# Patient Record
Sex: Female | Born: 1950
Health system: Southern US, Community
[De-identification: ages and names within clinical notes are randomized; demographics above are authoritative.]

## PROBLEM LIST (undated history)

## (undated) DIAGNOSIS — I10 Essential (primary) hypertension: Secondary | ICD-10-CM

## (undated) DIAGNOSIS — E785 Hyperlipidemia, unspecified: Secondary | ICD-10-CM

## (undated) DIAGNOSIS — I2510 Atherosclerotic heart disease of native coronary artery without angina pectoris: Secondary | ICD-10-CM

## (undated) DIAGNOSIS — G4733 Obstructive sleep apnea (adult) (pediatric): Secondary | ICD-10-CM

## (undated) DIAGNOSIS — E669 Obesity, unspecified: Secondary | ICD-10-CM

## (undated) DIAGNOSIS — M329 Systemic lupus erythematosus, unspecified: Secondary | ICD-10-CM

## (undated) DIAGNOSIS — I4510 Unspecified right bundle-branch block: Secondary | ICD-10-CM

## (undated) DIAGNOSIS — G4730 Sleep apnea, unspecified: Secondary | ICD-10-CM

## (undated) HISTORY — DX: Sleep apnea, unspecified: G47.30

## (undated) HISTORY — DX: Systemic lupus erythematosus, unspecified: M32.9

## (undated) HISTORY — DX: Essential (primary) hypertension: I10

## (undated) HISTORY — DX: Obesity, unspecified: E66.9

## (undated) HISTORY — PX: CORONARY ANGIOPLASTY: SHX604

## (undated) HISTORY — DX: Atherosclerotic heart disease of native coronary artery without angina pectoris: I25.10

## (undated) HISTORY — DX: Hyperlipidemia, unspecified: E78.5

## (undated) HISTORY — DX: Unspecified right bundle-branch block: I45.10

## (undated) HISTORY — DX: Obstructive sleep apnea (adult) (pediatric): G47.33

---

## 1998-11-07 ENCOUNTER — Other Ambulatory Visit: Admission: RE | Admit: 1998-11-07 | Discharge: 1998-11-07 | Payer: Self-pay | Admitting: Internal Medicine

## 2000-07-24 ENCOUNTER — Encounter: Payer: Self-pay | Admitting: Emergency Medicine

## 2000-07-24 ENCOUNTER — Inpatient Hospital Stay (HOSPITAL_COMMUNITY): Admission: EM | Admit: 2000-07-24 | Discharge: 2000-07-27 | Payer: Self-pay | Admitting: Emergency Medicine

## 2000-08-29 ENCOUNTER — Other Ambulatory Visit: Admission: RE | Admit: 2000-08-29 | Discharge: 2000-08-29 | Payer: Self-pay | Admitting: *Deleted

## 2001-01-01 ENCOUNTER — Encounter (INDEPENDENT_AMBULATORY_CARE_PROVIDER_SITE_OTHER): Payer: Self-pay

## 2001-01-01 ENCOUNTER — Observation Stay (HOSPITAL_COMMUNITY): Admission: RE | Admit: 2001-01-01 | Discharge: 2001-01-02 | Payer: Self-pay | Admitting: Obstetrics and Gynecology

## 2001-03-06 ENCOUNTER — Ambulatory Visit (HOSPITAL_COMMUNITY): Admission: RE | Admit: 2001-03-06 | Discharge: 2001-03-06 | Payer: Self-pay | Admitting: Obstetrics and Gynecology

## 2001-03-06 ENCOUNTER — Encounter: Payer: Self-pay | Admitting: Obstetrics and Gynecology

## 2001-09-18 ENCOUNTER — Other Ambulatory Visit: Admission: RE | Admit: 2001-09-18 | Discharge: 2001-09-18 | Payer: Self-pay | Admitting: Obstetrics and Gynecology

## 2003-01-06 ENCOUNTER — Inpatient Hospital Stay (HOSPITAL_COMMUNITY): Admission: EM | Admit: 2003-01-06 | Discharge: 2003-01-08 | Payer: Self-pay | Admitting: Emergency Medicine

## 2003-01-07 ENCOUNTER — Encounter (INDEPENDENT_AMBULATORY_CARE_PROVIDER_SITE_OTHER): Payer: Self-pay | Admitting: Cardiology

## 2003-01-08 ENCOUNTER — Encounter: Payer: Self-pay | Admitting: *Deleted

## 2003-02-22 ENCOUNTER — Encounter: Payer: Self-pay | Admitting: Internal Medicine

## 2003-02-22 ENCOUNTER — Encounter: Admission: RE | Admit: 2003-02-22 | Discharge: 2003-02-22 | Payer: Self-pay | Admitting: Internal Medicine

## 2003-07-06 ENCOUNTER — Ambulatory Visit (HOSPITAL_COMMUNITY): Admission: RE | Admit: 2003-07-06 | Discharge: 2003-07-06 | Payer: Self-pay | Admitting: Family Medicine

## 2003-07-06 ENCOUNTER — Encounter: Payer: Self-pay | Admitting: Family Medicine

## 2003-08-03 ENCOUNTER — Encounter: Payer: Self-pay | Admitting: Family Medicine

## 2003-08-03 ENCOUNTER — Ambulatory Visit (HOSPITAL_COMMUNITY): Admission: RE | Admit: 2003-08-03 | Discharge: 2003-08-03 | Payer: Self-pay | Admitting: Family Medicine

## 2003-09-03 ENCOUNTER — Ambulatory Visit (HOSPITAL_COMMUNITY): Admission: RE | Admit: 2003-09-03 | Discharge: 2003-09-03 | Payer: Self-pay | Admitting: Family Medicine

## 2003-09-09 ENCOUNTER — Ambulatory Visit (HOSPITAL_COMMUNITY): Admission: RE | Admit: 2003-09-09 | Discharge: 2003-09-09 | Payer: Self-pay | Admitting: Family Medicine

## 2004-03-08 ENCOUNTER — Ambulatory Visit (HOSPITAL_COMMUNITY): Admission: RE | Admit: 2004-03-08 | Discharge: 2004-03-08 | Payer: Self-pay | Admitting: Pulmonary Disease

## 2004-03-10 ENCOUNTER — Ambulatory Visit (HOSPITAL_COMMUNITY): Admission: RE | Admit: 2004-03-10 | Discharge: 2004-03-10 | Payer: Self-pay | Admitting: Family Medicine

## 2004-03-24 ENCOUNTER — Inpatient Hospital Stay (HOSPITAL_COMMUNITY): Admission: RE | Admit: 2004-03-24 | Discharge: 2004-03-30 | Payer: Self-pay | Admitting: Thoracic Surgery

## 2004-03-24 ENCOUNTER — Encounter (INDEPENDENT_AMBULATORY_CARE_PROVIDER_SITE_OTHER): Payer: Self-pay | Admitting: Specialist

## 2004-04-05 ENCOUNTER — Encounter: Admission: RE | Admit: 2004-04-05 | Discharge: 2004-04-05 | Payer: Self-pay | Admitting: Thoracic Surgery

## 2004-05-09 ENCOUNTER — Encounter: Admission: RE | Admit: 2004-05-09 | Discharge: 2004-05-09 | Payer: Self-pay | Admitting: Thoracic Surgery

## 2004-07-11 ENCOUNTER — Encounter: Admission: RE | Admit: 2004-07-11 | Discharge: 2004-07-11 | Payer: Self-pay | Admitting: Thoracic Surgery

## 2004-09-20 ENCOUNTER — Encounter: Admission: RE | Admit: 2004-09-20 | Discharge: 2004-09-20 | Payer: Self-pay

## 2004-10-10 ENCOUNTER — Encounter: Admission: RE | Admit: 2004-10-10 | Discharge: 2004-10-10 | Payer: Self-pay | Admitting: Thoracic Surgery

## 2007-09-01 ENCOUNTER — Inpatient Hospital Stay (HOSPITAL_COMMUNITY): Admission: EM | Admit: 2007-09-01 | Discharge: 2007-09-10 | Payer: Self-pay | Admitting: Emergency Medicine

## 2007-09-01 ENCOUNTER — Ambulatory Visit: Payer: Self-pay | Admitting: Surgery

## 2007-09-02 HISTORY — PX: CORONARY ARTERY BYPASS GRAFT: SHX141

## 2007-09-02 HISTORY — PX: CARDIAC CATHETERIZATION: SHX172

## 2007-09-30 ENCOUNTER — Ambulatory Visit: Payer: Self-pay | Admitting: Surgery

## 2007-09-30 ENCOUNTER — Encounter: Admission: RE | Admit: 2007-09-30 | Discharge: 2007-09-30 | Payer: Self-pay | Admitting: Surgery

## 2008-01-09 ENCOUNTER — Ambulatory Visit: Payer: Self-pay | Admitting: Surgery

## 2009-05-05 ENCOUNTER — Other Ambulatory Visit: Admission: RE | Admit: 2009-05-05 | Discharge: 2009-05-05 | Payer: Self-pay | Admitting: Family Medicine

## 2009-05-16 ENCOUNTER — Ambulatory Visit (HOSPITAL_COMMUNITY): Admission: RE | Admit: 2009-05-16 | Discharge: 2009-05-16 | Payer: Self-pay | Admitting: Family Medicine

## 2010-06-27 ENCOUNTER — Ambulatory Visit (HOSPITAL_COMMUNITY): Admission: RE | Admit: 2010-06-27 | Discharge: 2010-06-27 | Payer: Self-pay | Admitting: Family Medicine

## 2010-06-28 ENCOUNTER — Ambulatory Visit: Payer: Self-pay | Admitting: Oncology

## 2010-07-24 LAB — CBC WITH DIFFERENTIAL/PLATELET
BASO%: 0.2 % (ref 0.0–2.0)
Basophils Absolute: 0 10*3/uL (ref 0.0–0.1)
EOS%: 1.1 % (ref 0.0–7.0)
Eosinophils Absolute: 0.1 10*3/uL (ref 0.0–0.5)
HCT: 42.7 % (ref 34.8–46.6)
HGB: 13.8 g/dL (ref 11.6–15.9)
LYMPH%: 48.1 % (ref 14.0–49.7)
MCH: 29.6 pg (ref 25.1–34.0)
MCHC: 32.3 g/dL (ref 31.5–36.0)
MCV: 91.4 fL (ref 79.5–101.0)
MONO#: 0.8 10*3/uL (ref 0.1–0.9)
MONO%: 15.8 % — ABNORMAL HIGH (ref 0.0–14.0)
NEUT#: 1.8 10*3/uL (ref 1.5–6.5)
NEUT%: 34.8 % — ABNORMAL LOW (ref 38.4–76.8)
Platelets: 173 10*3/uL (ref 145–400)
RBC: 4.67 10*6/uL (ref 3.70–5.45)
RDW: 14.9 % — ABNORMAL HIGH (ref 11.2–14.5)
WBC: 5.3 10*3/uL (ref 3.9–10.3)
lymph#: 2.6 10*3/uL (ref 0.9–3.3)
nRBC: 0 % (ref 0–0)

## 2010-07-24 LAB — COMPREHENSIVE METABOLIC PANEL
ALT: 20 U/L (ref 0–35)
AST: 33 U/L (ref 0–37)
Albumin: 3.8 g/dL (ref 3.5–5.2)
Alkaline Phosphatase: 62 U/L (ref 39–117)
BUN: 9 mg/dL (ref 6–23)
CO2: 29 mEq/L (ref 19–32)
Calcium: 9.1 mg/dL (ref 8.4–10.5)
Chloride: 105 mEq/L (ref 96–112)
Creatinine, Ser: 0.84 mg/dL (ref 0.40–1.20)
Glucose, Bld: 86 mg/dL (ref 70–99)
Potassium: 3.9 mEq/L (ref 3.5–5.3)
Sodium: 139 mEq/L (ref 135–145)
Total Bilirubin: 0.9 mg/dL (ref 0.3–1.2)
Total Protein: 8.4 g/dL — ABNORMAL HIGH (ref 6.0–8.3)

## 2010-07-24 LAB — LACTATE DEHYDROGENASE: LDH: 207 U/L (ref 94–250)

## 2010-07-31 LAB — SEDIMENTATION RATE: Sed Rate: 22 mm/hr (ref 0–22)

## 2010-07-31 LAB — HYPERCOAGULABLE PANEL, COMPREHENSIVE
AntiThromb III Func: 109 % (ref 76–126)
Anticardiolipin IgA: 6 APL U/mL (ref <22)
Anticardiolipin IgG: 5 GPL U/mL (ref <23)
Anticardiolipin IgM: 2 MPL U/mL (ref <11)
Beta-2 Glyco I IgG: 3 G Units (ref <20)
Beta-2-Glycoprotein I IgA: 5 A Units (ref <20)
Beta-2-Glycoprotein I IgM: 3 M Units (ref <20)
DRVVT 1:1 Mix: 41.6 secs (ref 36.2–44.3)
DRVVT: 48.8 secs — ABNORMAL HIGH (ref 36.2–44.3)
Homocysteine: 9.7 umol/L (ref 4.0–15.4)
Lupus Anticoagulant: NOT DETECTED
PTT Lupus Anticoagulant: 40 secs (ref 30.0–45.6)
Protein C Activity: 15 % — ABNORMAL LOW (ref 75–133)
Protein C, Total: 52 % — ABNORMAL LOW (ref 70–140)
Protein S Activity: <15 % (ref 69–129)
Protein S Ag, Total: 60 % — ABNORMAL LOW (ref 70–140)

## 2010-07-31 LAB — ANTI-NUCLEAR AB-TITER (ANA TITER): ANA Titer 1: 1:640 {titer} — ABNORMAL HIGH

## 2010-07-31 LAB — C-REACTIVE PROTEIN: CRP: 0.5 mg/dL (ref <0.6)

## 2010-07-31 LAB — ANA: Anti Nuclear Antibody(ANA): POSITIVE — AB

## 2010-07-31 LAB — D-DIMER, QUANTITATIVE (NOT AT ARMC): D-Dimer, Quant: 0.22 ug/mL-FEU (ref 0.00–0.48)

## 2010-08-16 ENCOUNTER — Ambulatory Visit: Payer: Self-pay | Admitting: Oncology

## 2010-08-22 ENCOUNTER — Ambulatory Visit: Payer: Self-pay | Admitting: Vascular Surgery

## 2010-08-22 ENCOUNTER — Encounter (HOSPITAL_COMMUNITY): Payer: Self-pay | Admitting: Oncology

## 2010-08-22 ENCOUNTER — Ambulatory Visit: Admission: RE | Admit: 2010-08-22 | Discharge: 2010-08-22 | Payer: Self-pay | Admitting: Oncology

## 2010-10-17 ENCOUNTER — Ambulatory Visit: Payer: Self-pay | Admitting: Oncology

## 2010-10-19 LAB — CBC WITH DIFFERENTIAL/PLATELET
BASO%: 0 % (ref 0.0–2.0)
Basophils Absolute: 0 10*3/uL (ref 0.0–0.1)
EOS%: 1.1 % (ref 0.0–7.0)
Eosinophils Absolute: 0.1 10*3/uL (ref 0.0–0.5)
HCT: 43.9 % (ref 34.8–46.6)
HGB: 14.7 g/dL (ref 11.6–15.9)
LYMPH%: 41.2 % (ref 14.0–49.7)
MCH: 29.8 pg (ref 25.1–34.0)
MCHC: 33.5 g/dL (ref 31.5–36.0)
MCV: 89 fL (ref 79.5–101.0)
MONO#: 0.5 10*3/uL (ref 0.1–0.9)
MONO%: 10 % (ref 0.0–14.0)
NEUT#: 2.3 10*3/uL (ref 1.5–6.5)
NEUT%: 47.7 % (ref 38.4–76.8)
Platelets: 193 10*3/uL (ref 145–400)
RBC: 4.93 10*6/uL (ref 3.70–5.45)
RDW: 13.2 % (ref 11.2–14.5)
WBC: 4.7 10*3/uL (ref 3.9–10.3)
lymph#: 1.9 10*3/uL (ref 0.9–3.3)
nRBC: 0 % (ref 0–0)

## 2010-10-24 LAB — COMPREHENSIVE METABOLIC PANEL
ALT: 20 U/L (ref 0–35)
AST: 34 U/L (ref 0–37)
Albumin: 4.1 g/dL (ref 3.5–5.2)
Alkaline Phosphatase: 71 U/L (ref 39–117)
BUN: 14 mg/dL (ref 6–23)
CO2: 25 mEq/L (ref 19–32)
Calcium: 9.4 mg/dL (ref 8.4–10.5)
Chloride: 102 mEq/L (ref 96–112)
Creatinine, Ser: 0.87 mg/dL (ref 0.40–1.20)
Glucose, Bld: 112 mg/dL — ABNORMAL HIGH (ref 70–99)
Potassium: 4 mEq/L (ref 3.5–5.3)
Sodium: 136 mEq/L (ref 135–145)
Total Bilirubin: 0.3 mg/dL (ref 0.3–1.2)
Total Protein: 7.6 g/dL (ref 6.0–8.3)

## 2010-10-24 LAB — PROTEIN C ACTIVITY: Protein C Activity: 140 % — ABNORMAL HIGH (ref 75–133)

## 2010-10-24 LAB — D-DIMER, QUANTITATIVE (NOT AT ARMC): D-Dimer, Quant: 0.38 ug/mL-FEU (ref 0.00–0.48)

## 2010-10-24 LAB — PROTEIN C, TOTAL: Protein C, Total: 124 % (ref 70–140)

## 2010-10-24 LAB — LACTATE DEHYDROGENASE: LDH: 201 U/L (ref 94–250)

## 2010-10-24 LAB — PROTEIN S ACTIVITY: Protein S Activity: 46 % — ABNORMAL LOW (ref 69–129)

## 2010-10-24 LAB — PROTEIN S, TOTAL: Protein S Ag, Total: 99 % (ref 70–140)

## 2010-11-16 ENCOUNTER — Ambulatory Visit: Payer: Self-pay | Admitting: Oncology

## 2010-11-22 LAB — PROTEIN S, TOTAL: Protein S Ag, Total: 98 % (ref 70–140)

## 2010-11-22 LAB — PROTEIN S, ANTIGEN, FREE: Protein S Ag, Free: 60 % normal (ref 50–147)

## 2010-11-22 LAB — PROTEIN S ACTIVITY: Protein S Activity: 43 % — ABNORMAL LOW (ref 69–129)

## 2011-02-20 ENCOUNTER — Other Ambulatory Visit (HOSPITAL_COMMUNITY): Payer: Self-pay | Admitting: Oncology

## 2011-02-20 ENCOUNTER — Encounter (HOSPITAL_BASED_OUTPATIENT_CLINIC_OR_DEPARTMENT_OTHER): Payer: Medicare Other | Admitting: Oncology

## 2011-02-20 DIAGNOSIS — M329 Systemic lupus erythematosus, unspecified: Secondary | ICD-10-CM

## 2011-02-20 DIAGNOSIS — Z7901 Long term (current) use of anticoagulants: Secondary | ICD-10-CM

## 2011-02-20 DIAGNOSIS — Z86718 Personal history of other venous thrombosis and embolism: Secondary | ICD-10-CM

## 2011-02-20 LAB — CBC WITH DIFFERENTIAL/PLATELET
BASO%: 0.2 % (ref 0.0–2.0)
Basophils Absolute: 0 10*3/uL (ref 0.0–0.1)
EOS%: 1.4 % (ref 0.0–7.0)
Eosinophils Absolute: 0.1 10*3/uL (ref 0.0–0.5)
HCT: 45.3 % (ref 34.8–46.6)
HGB: 14.8 g/dL (ref 11.6–15.9)
LYMPH%: 37.9 % (ref 14.0–49.7)
MCH: 29.2 pg (ref 25.1–34.0)
MCHC: 32.7 g/dL (ref 31.5–36.0)
MCV: 89.3 fL (ref 79.5–101.0)
MONO#: 0.4 10*3/uL (ref 0.1–0.9)
MONO%: 8.3 % (ref 0.0–14.0)
NEUT#: 2.2 10*3/uL (ref 1.5–6.5)
NEUT%: 52.2 % (ref 38.4–76.8)
Platelets: 178 10*3/uL (ref 145–400)
RBC: 5.07 10*6/uL (ref 3.70–5.45)
RDW: 13.9 % (ref 11.2–14.5)
WBC: 4.2 10*3/uL (ref 3.9–10.3)
lymph#: 1.6 10*3/uL (ref 0.9–3.3)
nRBC: 0 % (ref 0–0)

## 2011-02-21 LAB — COMPREHENSIVE METABOLIC PANEL
ALT: 19 U/L (ref 0–35)
AST: 28 U/L (ref 0–37)
Albumin: 4.2 g/dL (ref 3.5–5.2)
Alkaline Phosphatase: 68 U/L (ref 39–117)
BUN: 17 mg/dL (ref 6–23)
CO2: 27 mEq/L (ref 19–32)
Calcium: 9.6 mg/dL (ref 8.4–10.5)
Chloride: 104 mEq/L (ref 96–112)
Creatinine, Ser: 1.03 mg/dL (ref 0.40–1.20)
Glucose, Bld: 132 mg/dL — ABNORMAL HIGH (ref 70–99)
Potassium: 3.9 mEq/L (ref 3.5–5.3)
Sodium: 141 mEq/L (ref 135–145)
Total Bilirubin: 0.4 mg/dL (ref 0.3–1.2)
Total Protein: 7.8 g/dL (ref 6.0–8.3)

## 2011-02-21 LAB — D-DIMER, QUANTITATIVE (NOT AT ARMC): D-Dimer, Quant: 0.55 ug/mL-FEU — ABNORMAL HIGH (ref 0.00–0.48)

## 2011-02-21 LAB — LACTATE DEHYDROGENASE: LDH: 184 U/L (ref 94–250)

## 2011-02-23 LAB — PROTEIN S ACTIVITY: Protein S Activity: 59 % — ABNORMAL LOW (ref 69–129)

## 2011-02-23 LAB — PROTEIN S, ANTIGEN, FREE: Protein S Ag, Free: 51 % normal (ref 50–147)

## 2011-03-20 NOTE — Op Note (Signed)
[name], [name]             ACCOUNT NO.:  [id number]   MEDICAL RECORD NO.:  [id number]          PATIENT TYPE:  INP   LOCATION:  2308                         FACILITY:  [facility]   PHYSICIAN:  Evelene Croon, M.D.     DATE OF BIRTH:  11-Mar-1951   DATE OF PROCEDURE:  09/02/2007  DATE OF DISCHARGE:                               OPERATIVE REPORT   CONTINUATION:   The left greater saphenous vein was of large caliber, but good quality.   Then dissection of the pericardial adhesions was performed to expose the  right atrium and the ascending aorta.  The patient was then heparinized  and when an adequate ACT was obtained. the distal ascending aorta was  cannulated using a 20-French aortic cannula for arterial inflow.  Venous  outflow was achieved using a two-stage venous cannula through the right  atrial appendage.  An antegrade cardioplegia and vent cannula was  inserted in the aortic root.   The patient placed on cardiopulmonary bypass and the remainder of the  heart was dissected from the pericardium.  There were thick adhesions  present throughout the pericardial cavity was complete obliteration;  these were not the typical adhesions.  After completely freeing the  heart, the aorta was then crossclamped and 1000 mL of cold blood  antegrade cardioplegia were administered in the aortic root with quick  arrest the heart.  Systemic hypothermia to 28 degrees centigrade and  topical hypothermic with iced saline was used.  A temperature probe was  placed in the septum and an insulating pad in the pericardium.   The first distal anastomosis was performed to the obtuse marginal  branch.  This vessel was diffusely diseased and intramyocardial along  its entire extent.  It was located distally, where it was soft enough to  graft.  The internal diameter was about 1.75 mm.  The conduit that was  used was a segment of greater saphenous vein and the anastomosis  performed in an end-to-side manner  using continuous 7-0 Prolene suture.  Flow was noted through the graft and was excellent.   Then the second distal anastomosis was performed to the distal right  coronary artery before the take-off the posterior descending branch.  The right coronary artery was diffusely diseased with calcific plaque,  but there was one area that was soft enough to graft.  The internal  diameter was 1.75 mm.  The conduit that was used was a second segment of  greater saphenous vein and the anastomosis performed in an end-to-side  manner using a continuous 7-0 Prolene suture.  Flow was measured through  the graft and was excellent.  Then another dose of cardioplegia was  given down both vein grafts and in the aortic root.   The third distal anastomosis was then performed to the mid to distal  portion of the left anterior descending coronary.  The vessel was also  intramyocardial along its entire extent.  It was located approximately  at the junction of the middle and distal third, where it was soft enough  to graft.  The internal diameter of this vessel was about  2 mm.  The  conduit that was used was the left internal mammary graft and this was  brought through an opening in the left pericardium anterior to the  phrenic nerve.  It was anastomosed to the LAD in an end-to-side manner  using continuous 8-0 Prolene suture.  The pedicle was sutured to the  epicardium with 6-0 Prolene sutures.  Then the patient rewarmed to 37  degrees centigrade.  Another dose of cardioplegia was given and then the  2 proximal vein graft anastomoses were performed to the aortic root in  an end-to-side manner using continuous 6-0 Prolene suture.  The clamp  was then removed from the mammary artery pedicle.  There was rapid  rewarming of the ventricular septum and return of spontaneous  ventricular fibrillation.  The crossclamp was removed with a time of 118  minutes and the patient defibrillated into sinus rhythm.   The  proximal and distal anastomoses appeared hemostatic and lines of the  grafts satisfactory.  Graft markers were placed around the proximal  anastomoses.  Two temporary right ventricular and right atrial pacing  wires were placed and brought out through the skin.   When the patient had rewarmed to 37 degrees centigrade, she was weaned  from cardiopulmonary bypass on low-dose dopamine.  Total bypass time was  154 minutes.  Cardiac function appeared preserved with cardiac output of  4.5 L a minute.  Then protamine was given and the venous and aortic  cannulas were removed without difficulty.  Hemostasis was achieved.  The  patient was given 10 units of platelets and 2 units of fresh frozen  plasma, since she had been on Coumadin and her INR was 2 preoperatively  and her platelet count was 80,000 intraoperatively while she was on  Integrilin preop.  After these blood products were given, there was good  hemostasis.  Two chest tubes were placed with a tube in the posterior  pericardium and one in the anterior mediastinum.  A tube was not placed  in left pleural space, since it was obliterated from her previous  surgery.  The sternum was then reapproximated with double #6 stainless  steel wires.  The fascia was closed with a continuous #1 Vicryl suture.  Subcutaneous tissues were closed with continuous 2-0 Vicryl and the skin  with a 3-0 Vicryl subcuticular closure.  The lower extremity vein  harvest site was closed in layers in a similar manner.  The sponge,  needle and instrument counts were correct according to the scrub nurse.  Dry sterile dressing were applied over the incisions and around the  chest tubes, which were hooked to Pleur-evac suction.  The patient  remained hemodynamically stable and was transported to the SICU in  guarded but stable condition.      Evelene Croon, M.D.  Electronically Signed     BB/MEDQ  D:  09/02/2007  T:  09/03/2007  Job:  [id number]   cc:   Nicki Guadalajara, M.D.

## 2011-03-20 NOTE — Op Note (Signed)
Kylie, Swanson             ACCOUNT NO.:  [id number]   MEDICAL RECORD NO.:  [id number]          PATIENT TYPE:  INP   LOCATION:  2308                         FACILITY:  [facility]   PHYSICIAN:  Evelene Croon, M.D.     DATE OF BIRTH:  1950-11-23   DATE OF PROCEDURE:  09/02/2007  DATE OF DISCHARGE:                               OPERATIVE REPORT   PREOPERATIVE AND POSTOPERATIVE DIAGNOSIS:  Left main and severe three-  vessel coronary disease, status post non-ST-segment elevation myocardial  infarction.   OPERATIVE PROCEDURE:  Emergency median sternotomy, extracorporeal  circulation, coronary bypass graft surgery x3 using a left internal  mammary artery graft to left anterior descending coronary, with a  saphenous vein graft to obtuse marginal branch of the left circumflex  coronary, and a saphenous vein graft to the right coronary.  Endoscopic  vein harvesting from the left leg.   ATTENDING SURGEON:  Dr. Evelene Croon.   ASSISTANT:  Sheliah Plane, MD   SECOND ASSISTANT:  Lenise Herald, [facility]   ANESTHESIA:  General endotracheal.   CLINICAL HISTORY:  This patient is a 60 year old obese woman with lupus  who was admitted with a non ST-segment elevation MI and ongoing chest  pain.  She was taken to cardiac catheterization lab this morning  underwent an urgent catheterization by Dr. Nicki Guadalajara.  This showed  about 70% ostial left main stenosis.  The LAD had 40% proximal and 80%  midvessel stenosis.  The left circumflex had 80-85% ostial stenosis and  then a 95% proximal stenosis.  The obtuse marginal was a large branching  vessel that had 85% proximal stenosis and TIMI  1 to 2 flow down the  vessel.  This appeared be a culprit vessel.  The right coronary artery  was occluded with very faint collaterals from the left filling of distal  right coronary artery.  Left ventricular ejection fraction about 55%.  There is no gradient across the aortic valve and no mitral  regurgitation.  After  review of the cardiac catheterization and  examination the patient in the cath lab I felt the best treatment would  be to proceed with emergency coronary bypass graft surgery.  I discussed  the operative procedure with the patient and her husband.  We discussed  alternatives, benefits, and risks including but not limited to bleeding,  blood transfusion, infection, stroke, myocardial infarction, graft  failure, and death.  They understood and agreed to proceed.   OPERATIVE PROCEDURE:  The patient was taken to the operating room and  placed on the table in supine position.  She still had a right femoral  arterial sheath from her catheterization.  A left radial arterial line  was placed by anesthesiology as well as a right internal jugular  pulmonary artery catheter.  After induction of general endotracheal  anesthesia, the patient was positioned on the table in supine position.  A Foley catheter was placed in the bladder using sterile technique.  Then the chest, abdomen and both lower extremities were prepped and  draped in usual sterile manner.  The chest was entered through a median  sternotomy  incision.  The patient has a very thick subcutaneous fat  layer.  The pericardial space was completely obliterated by dense  adhesions.  There was a thick fibrous layer encasing the heart.  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 Dewayne Shorter.   Then the left internal mammary artery was harvested from the chest wall  as a pedicle graft.  This is a medium caliber vessel with excellent  blood pressure.  At the same time, a segment of greater saphenous vein  was harvested from the left leg using endoscopic vein harvest technique  and this vein was a medium size and good quality.   DICTATION ENDED HERE      Evelene Croon, M.D.  Electronically  Signed     BB/MEDQ  D:  09/02/2007  T:  09/03/2007  Job:  [id number]

## 2011-03-20 NOTE — Cardiovascular Report (Signed)
NAMEHONESTIE, [name] NO.:  [id number]   MEDICAL RECORD NO.:  [id number]          PATIENT TYPE:  INP   LOCATION:  2399                         FACILITY:  [facility]   PHYSICIAN:  Nicki Guadalajara, M.D.     DATE OF BIRTH:  26-Aug-1951   DATE OF PROCEDURE:  09/02/2007  DATE OF DISCHARGE:                            CARDIAC CATHETERIZATION   INDICATIONS:  [name] is a 60 year old African American  female who has a history of lupus diagnosed in 2005.  She also has a  remote history of DVT, for which she has been on Coumadin.  Apparently  she had been having intermittent episodes of recurrent chest pain for  some time, which she has ascribed to reflux disease.  She ultimately  presented to [facility] yesterday with chest pain.  She was on  Coumadin therapy and was anticoagulated with an INR of 2.0.  Her ECG  showed inferolateral T-wave inversion and cardiac enzymes subsequently  had been positive for a non-ST segment elevation MI.  Because the  patient did have recurrent chest pain, she was started on Integrilin and  heparin with improvement.  She again experienced recurrent chest pain  this morning.  She is now referred for definitive cardiac  catheterization despite her INR being 2.0.  CK total was 1666 with MB  247, troponin 9.22.   PROCEDURE:  After premedication with Valium 5 mg intravenously, the  patient was prepped and draped in the usual fashion.  The right femoral  artery was punctured anteriorly and a 5-French sheath was inserted.  Diagnostic catheterization was done utilizing 5-French Judkins #4 left  and right coronary catheters.  IC nitroglycerin was also administered  down the right coronary artery.  A right catheter was used for selective  angiography into left subclavian system since the patient most likely  will require CBG surgery.  Pigtail catheter was used for biplane cine  left ventriculography as well as distal aortography.   After  the catheterization, I broke scrub.  I reviewed the angiograms in  detail.  Although the RCA was totally occluded, this appears to be  chronic due to very faint bridging collaterals.  With the patient's  ostial left main disease and high-grade circumflex multiple diffuse  circumflex stenoses with reduced TIMI flow in the OM vessel, in light of  LAD disease, surgical consultation was obtained with Dr. Laneta Simmers.  Since  the patient has had recurrent chest pain on Integrilin and heparin,  despite an INR of 2.0, with her high-risk anatomy a decision was made to  plan surgery, CABG revascularization surgery, today.  The arterial  sheath was sutured in place.   HEMODYNAMIC DATA:  Central aortic pressure was 109/88.  Left ventricular  pressure 109/29.   ANGIOGRAPHIC DATA:  Left main coronary artery had ostial tubular 70%  narrowing.  The left main bifurcated into the LAD and left circumflex  system.   The proximal LAD after a small first diagonal vessel had 40% narrowing,  followed by 80% stenosis.  The LAD was a large-caliber vessel.  There  was 80% distal apical LAD stenosis.  The LAD wrapped around the apex and  there was very faint collateralization of the distal RCA.   The circumflex vessel had 80-85% ostial disease diffusely and then  became 95% stenosed before giving rise to a large marginal branch.  There was 95% proximal stenosis in this marginal branch with TIMI-1 to  TIMI-2 flow more distally in this branch, which was large and extended  to the apex and supplied several additional branches towards the LV  apex.   The left subclavian artery had mild 20-30% narrowing on a bend in the  vessel.  The LIMA itself was large-caliber and suitable for bypass  surgery.   Biplane selective left ventriculography revealed overall preserved  global contractility with an ejection fraction of 55%.  On the RAO  projection there was mid to basal mild to moderate posterior  hypocontractility with  faint mid anterolateral hypocontractility.  On  the LAO projection there was a mild moderate posterolateral  hypocontractility.   Distal aortography did not demonstrate any significant aortoiliac  disease.  Renal arteries were patent.   IMPRESSION:  1. Preserved global contractility with mid to basal inferior      hypocontractility as well as posterolateral hypocontractility.  2. Severe multivessel coronary artery disease with tubular ostial      narrowing of the left main coronary artery; 80% proximal to mid      stenosis in the left anterior descending artery with 80% distal      left anterior descending artery stenosis; diffuse 85% ostial      circumflex disease followed by 95% proximal circumflex stenosis and      95% stenosis in a large obtuse marginal #1 vessel with reduced TIMI-      2 flow distally in a large marginal; and total occlusion of the      right coronary artery with faint right-to-right and left-to-right      collaterals.   After discussion with Dr. Laneta Simmers at length, the feeling is most likely  the culprit vessel is the circumflex rather than the RCA.  One view of  the RCA suggests faint bridging collaterals supplying faint antegrade  right-to-right flow, and there are some faint left-to-right collaterals.  There is reduced flow in the circumflex marginal vessel and with the  patient's chest pain on IV Integrilin and heparin with therapeutic INR,  there is significant concern about disease stability.  She is now pain-  free.  A lengthy discussion was done well with the patient as well as  with the patient's family members.  Plans are for CABG revascularization  surgery today to be done by Dr. Laneta Simmers.           ______________________________  Nicki Guadalajara, M.D.     TK/MEDQ  D:  09/02/2007  T:  09/02/2007  Job:  [id number]   cc:   Darlin Priestly, MD  Merlene Laughter. Renae Gloss, M.D.

## 2011-03-20 NOTE — Discharge Summary (Signed)
[name], [name]             ACCOUNT NO.:  [id number]   MEDICAL RECORD NO.:  [id number]          PATIENT TYPE:  INP   LOCATION:  2041                         FACILITY:  [facility]   PHYSICIAN:  Evelene Croon, M.D.     DATE OF BIRTH:  Mar 13, 1951   DATE OF ADMISSION:  09/01/2007  DATE OF DISCHARGE:  09/10/2007                               DISCHARGE SUMMARY   HISTORY OF PRESENT ILLNESS:  The patient is a 60 year old black female  who has seen Dr. Jenne Campus back in 2001 who came to the emergency room  with chest pain.  The patient described, what she felt was, reflux  symptoms on and off since January of 2008.  On the night prior to  presentation, she tried to go to sleep about 10 o'clock and felt as  though she was having more indigestion.  She was restless until about 2  o'clock a.m. when the pain became constant.  It was midsternal and  radiated down her left arm.  She had associated diaphoresis, nausea and  some vomiting.  In the past, these symptoms have been relieved if she  burped.  She denied shortness of breath with this.  She went to her  primary care physician's office, but was referred to the emergency room.  She was seen by the ER physician and felt to require evaluation for  unstable angina and rule out myocardial infarction.  Her troponin was  noted to be elevated at 1.25 and CK-MB was 64.9.  A cardiology  consultation was obtained.  She was started on intravenous heparin and  intravenous nitroglycerin, as well as given morphine and aspirin.  She  had T wave inversions in the inferior and lateral leads, which she had  in the past, but they seemed more pronounced and she also had a Q wave  in leads 3 and AVF.  She was admitted to the CCU.  Continued on  nitroglycerin and heparin and started on beta-blocker, as well as ACE  inhibitors and statin.   PAST MEDICAL HISTORY:  1. History of systemic lupus erythematosus.  2. History of deep venous thrombosis on chronic  anticoagulation with      Coumadin.  3. She has a history of tachy palpitations.  4. History of previous lung surgery in 2005 by Dr. Edwyna Shell following      pneumonia.   MEDICATIONS PRIOR TO ADMISSION:  1. Coumadin 5 mg every day, except 2.5 mg on Monday, Wednesday and      Friday.  2. Plaquenil 200 mg every other day.  3. Cyclobenzaprine 10 mg daily.  4. Multivitamin 1 daily.  5. Hydrocodone p.r.n.  6. Tylenol p.r.n.  7. Nexium 40 mg daily.  8. Prednisone, which was last taken in May.   ALLERGIES:  1. ASPIRIN.  2. NONSTEROIDALS CAUSE STOMACH IRRITATION.   SOCIAL HISTORY:  Please see the history and physical done at the time of  admission.   FAMILY HISTORY:  Please see the history and physical done at the time of  admission.   REVIEW OF SYSTEMS:  Please see the history and physical done at the  time  of admission.   PHYSICAL EXAMINATION:  Please see the history and physical done at the  time of admission.   HOSPITAL COURSE:  The patient was medically stabilized.  She was taken  to the cardiac catheterization lab on September 02, 2007.  There, she was  found to have significant multi-vessel coronary artery disease with  preserved global contractility with mid to basal inferior  hypercontractility, as well as posterolateral hypercontractility.  Discussions were undertaken with thoracic surgery, Dr. Laneta Simmers.  The  feelings by the cardiologist was culprit vessel was a circumflex,  rather than the right coronary artery.  The patient was felt to be most  appropriate for proceeding with surgical revascularization and she was  taken emergently to the OR on September 02, 2007 by Dr. Laneta Simmers and  underwent the following procedure, emergency coronary artery bypass  grafting x3 with the following vessels bypassed:  1.  Left internal  mammary artery to the left anterior descending.  2.  Saphenous vein  graft to the obtuse marginal.  3.  Saphenous vein graft to the right  coronary artery.   The patient tolerated the surgery was and was taken to  the surgical intensive care unit in stable condition.   POSTOPERATIVE HOSPITAL COURSE:  The patient has, overall, had a slow,  but steady course.  She was weaned from the ventilator without  significant difficulties.  All inotropic support was discontinued in the  standard fashion and she has been hemodynamically stable.  She has a  postoperative anemia, which has required transfusion.  This has  stabilized.  Her most recent hemoglobin and hematocrit, dated September 09, 2007, were 8.5 and 25.6 respectively.  She has had moderate volume  overload requiring diuresis, which will continue as an outpatient.  Her  most recent BNP, dated September 09, 2007, was 858.  Oxygen has been  weaned and she maintains good saturations on room air.  Capillary blood  glucoses are under good control.  She is currently on Glucotrol.  Her  incisions are all healing well without signs of infection.  She has been  started on her anticoagulation therapy for previous diagnosis of DVT.  Her most recent INR, dated September 10, 2007, is 2.2.  She has been  weaned from oxygen and maintained good saturations.  She has had no  significant cardiac dysrhythmias or ectopy.  Her overall status is felt  to be stable for discharge on today's date, September 10, 2007.  She is  tolerating routine activities, commenced for the level of postoperative  convalescence using standard cardiac rehabilitation protocols.  Her  rhythm has been stable.  Note, she was initially tried on an ACE  inhibitor; however, did not tolerate it due to hypotension.  This may be  resumed as an outpatient after further stabilization.  Her volume  overload is improving daily on diuretics, which she will continue as an  outpatient.  Overall, her status is felt to be stable for discharge on  September 10, 2007.   DISCHARGE INSTRUCTIONS:  The patient received written instructions in  regard to medications,  activity, diet, wound care and followup.   FOLLOWUP:  Will include Dr. Laneta Simmers on September 30, 2007 at 12:30, Dr.  Jenne Campus on September 25, 2007 at 4 o'clock p.m.   MEDICATIONS AT DISCHARGE:  Will be:  1. Aspirin 81 mg daily.  2. Lopressor 50 mg twice daily.  3. Zocor 80 mg daily.  4. Coumadin 5 mg every Tuesday and Thursday,  2.5 mg on the other days.  5. Nexium 40 mg daily.  6. Lasix 40 mg daily x10 days.  7. K-Dur 20 mEq daily x10 days.  8. Glucotrol 5 mg daily.  9. Niferex 150 mg daily for pain.  10.Oxycodone 5 mg 1 to 2 every 4 to 6 hours as needed.   FINAL DIAGNOSIS:  Include the following, left main and severe 3-vessel  coronary artery disease, status post non-ST segment elevation myocardial  infarction, prompting surgical revascularization on an emergent basis as  described above.   OTHER DIAGNOSES:  Include:  1. Postoperative anemia.  2. History of systemic lupus erythematosus.  3. History of deep venous thrombosis.  4. History of previous lung surgery following pneumonia.  5. History of intolerance to NONSTEROIDALS and gastroesophageal reflux      disease.  6. History of tachy palpitations of a benign nature in the past.  7. Postoperative anemia, acute blood loss which is stable.  8. Postoperative volume overload, improving.  9. Diabetes mellitus type 2.      Rowe Clack, P.A.-C.      Evelene Croon, M.D.  Electronically Signed    WEG/MEDQ  D:  09/10/2007  T:  09/10/2007  Job:  [id number]   cc:   Darlin Priestly, MD  Dario Guardian, M.D.  Chase Picket, M.D.

## 2011-03-20 NOTE — H&P (Signed)
NAMENIKITHA, MODE NO.:  [id number]   MEDICAL RECORD NO.:  [id number]          PATIENT TYPE:  EMS   LOCATION:  MAJO                         FACILITY:  [facility]   PHYSICIAN:  Darlin Priestly, MD  DATE OF BIRTH:  04-04-1951   DATE OF ADMISSION:  09/01/2007  DATE OF DISCHARGE:                              HISTORY & PHYSICAL   HISTORY OF PRESENT ILLNESS:  [name] is a 60 year old  African American married female patient who has seen Dr. Jenne Campus back in  2001 who comes to the emergency room with chest pain.  She states that  she has been having reflux on and off since January 2008.  Last night  she tried to go to sleep about 10 o'clock and she had what she thought  was more indigestion.  She was up and down until 2:00 a.m. when the pain  became constant.  It was midsternal, radiated down her left arm.  She  had diaphoresis.  She did have nausea and some vomiting.  In the past  this has been relieved if she burped.  She denies any shortness of  breath with this.  She went to her primary care doctor's office, Dr.  Archie Endo and she was referred to the emergency room.  Here in the ER  some enzymes were drawn by the ER physician.  Her troponin was elevated  at 1.25.  CK-MB was 64.9, thus our service was called.  Since then she  has been started on IV heparin and IV nitroglycerin and has been given  morphine and aspirin.  She still has some pain that comes and goes.  Her  EKG shows sinus rhythm rate of 96.  She has T-wave inversions inferior  and lateral which she has had in the past, but they seem more pronounced  and she has large Q-wave in III and a Q-wave in aVF.   PAST MEDICAL HISTORY:  1. She was diagnosed with lupus in 2005.  2. She has a history of DVT, has been on anticoagulation, Coumadin.      Her INR now is 2.0.  She has had tachy palpitations.  She recently      wore a monitor from our office.  She was called and told it was      fine.  3.  She had lung surgery after pneumonia in 2005 by Dr. Edwyna Shell.   MEDICATIONS:  1. Coumadin 5 mg every day except 2.5 on Monday, Wednesday, Friday.  2. Hydroxychloroquine 200 mg every day (Plaquenil.)  3. Cyclobenzaprine 10 mg every day.  4. Multivitamin every day.  5. Hydrocodone p.r.n.  6. Tylenol p.r.n.  7. Nexium 40 mg a day.  8. Prednisone was last taken in May.   ALLERGIES:  ASPIRIN gives her stomach irritation and MOBIC.   FAMILY HISTORY:  Mother died at age 9.  She did have coronary disease,  diabetes and hypertension.  Father is still alive at age 66.  He also  has coronary disease.  She has five sisters; one with breast cancer, one  with thyroid cancer and two brothers, none  with CAD.   SOCIAL HISTORY:  She has two sons, however one was killed in 2004.  She  is unemployed now secondary to her lupus and she has disability pending.  She does not smoke, does not drink any alcohol and does not exercise.   REVIEW OF SYSTEMS:  She denies any fever, chills, cough, cold.  No  headache.  She has dentures top and bottom.  No rashes currently.  Positive chest pain.  Positive some mild lower extremity edema and  palpitations.  No syncope.  No dysuria.  Other systems are negative.   PHYSICAL EXAMINATION:  VITAL SIGNS:  Blood pressure is 128/64, pulse is  89 to 96, temperature is 97.  GENERAL:  She is in no acute distress.  HEENT:  Pupils are equal and round.  Sclerae clear.  She is edentulous.  She has top and bottom dentures.  NECK:  Supple.  No carotid bruits.  No  lymphadenopathy.  CARDIOVASCULAR:  Heart sounds regular.  S1, S2 present.  They are  distant.  I do not hear a murmur.  CHEST:  Lungs are clear to auscultation bilaterally.  SKIN:  She has no rashes.  ABDOMEN:  Bowel sounds are present x4.  No mass or tenderness.  No CVA  tenderness.  EXTREMITIES:  She has mild tibial edema.  She has no joint deformities.  NEUROLOGIC:  Alert and oriented x3.  Strength equal  bilaterally.   LABORATORY DATA:  Chest x-ray shows right base atelectasis.  EKG as  described above.  Hemoglobin 16.3, hematocrit 48.  Sodium 137, potassium  4.2, BUN 10, creatinine 0.9.  CK-MB was 64.9, troponin 1.25.  INR 2.0.   ASSESSMENT:  1. Non-ST elevation myocardial infarction with elevated troponin and      CK MB.  These were point of care markers in the ER.  2. History of lupus.  3. Unknown cholesterol.  4. Gastroesophageal reflux disease.  5. Anticoagulation.   PLAN:  We will admit her to CCU.  Cath when her INR is down.  Continue  IV nitroglycerin and heparin.  Will add beta blocker started with IV 5  mg x3 and then p.o. and will add an ACE and statin.  She may need to be  started on Aggrastat or Integrilin if she does not get her pain  relieved.      Lezlie Octave, N.P.      Darlin Priestly, MD  Electronically Signed    BB/MEDQ  D:  09/01/2007  T:  09/02/2007  Job:  [id number]   cc:   Dario Guardian, M.D.  Areatha Keas, M.D.

## 2011-03-20 NOTE — Consult Note (Signed)
NAMEGIADA, [name]             ACCOUNT NO.:  [id number]   MEDICAL RECORD NO.:  [id number]          PATIENT TYPE:  INP   LOCATION:  2399                         FACILITY:  [facility]   PHYSICIAN:  [name], M.D.     DATE OF BIRTH:  July 05, 1951   DATE OF CONSULTATION:  09/02/2007  DATE OF DISCHARGE:                                 CONSULTATION   REASON FOR CONSULTATION:  Left main and severe three-vessel coronary  disease with unstable angina and acute non-ST segment elevation MI.   CLINICAL HISTORY:  I was called to the cardiac catheterization lab to  evaluate this 60 year old woman for consideration of emergent coronary  artery bypass graft surgery by Dr. Nicki Swanson.  The patient has a  history of lupus since 2005 as well as a history of right leg DVT five  years ago for which she has been on Coumadin ever since.  According to  the patient and her husband she has had intermittent substernal chest  pain described as burning since January 2008.  She felt that this was  most likely reflux.  Over the past couple of months she has had a more  exertional substernal chest pain occurring with ambulation already  significant workup.  On Sunday of this week she developed substernal  chest pain which was different and more intense than her previous pain.  This persisted most of the day.  Then yesterday morning she developed  recurrent chest pain that was similar to the pain on Saturday with some  radiation into her left arm as well as nausea, vomiting, and  diaphoresis.  This pain lasted most of the day and then persisted  overnight, and she could not sleep.  She continued to have pain in the  morning.  Went to her primary physician's office and was sent to the  emergency room.  Her initial point of care markers in the emergency room  showed a myoglobin of greater than 500 with CK-MB 64.9 and troponin I  1.25.  Electrocardiogram showed T-wave inversions in inferolateral leads  with Q's in  leads III and aVF.  She was admitted with diagnosis of non-  ST segment elevation MI.  She was started on heparin and nitroglycerin  since she was continuing to have some chest pain and had resolution of  her pain.  Last night she developed recurrent chest pain and was started  on Integrilin an addition to the heparin and nitroglycerin.  This  morning she had some further pain and was taken to the cardiac  catheterization lab by Dr. Tresa Swanson.  This showed about 70% ostial left  main stenosis.  The left circumflex appeared to be the culprit with 80-  85% proximal stenosis and then 95% proximal stenosis.  There was a  single large branching marginal that had 85% proximal stenosis with  reduced flow, graded TIMI of 1-2.  LAD had 40% proximal and then 80%  midvessel stenosis.  There was also an 80% distal stenosis near the  apex.  The right coronary was occluded proximally with faint left-to-  right collaterals.  It was difficult  to see how good the distal vessel  was.  Left ventricular ejection fraction was about 55% with  posterolateral hypokinesis.  There was no gradient across the aortic  valve and no mitral regurgitation.  Abdominal aortogram was  unremarkable.   REVIEW OF SYSTEMS:  GENERAL:  She denies any fever or chills.  She has  had no recent weight changes.  She has had fatigue.  EYES:  Negative.  ENT:  She does wear dentures.  ENDOCRINE:  She denies diabetes and  hypothyroidism.  CARDIOVASCULAR:  As above.  She denies PND or  orthopnea.  She had no peripheral edema.  RESPIRATORY:  She denies cough  and sputum production.  GI:  She denies nausea and vomiting prior to  this episode of chest pain that brought her into the hospital.  She does  report some reflux symptoms of heartburn after eating.  She denies any  melena or bright red blood per rectum.  GU:  She denies dysuria and  hematuria.  MUSCULOSKELETAL:  She denies arthralgias and myalgias.  NEUROLOGICAL:  She denies any  dizziness or syncope.  She has had some  generalized weakness.  She denies any focal weakness or numbness.  She  has never had TIA or stroke.  Allergies to ASPIRIN which causes stomach  irritation and MOBIC.  PSYCHIATRIC:  Negative.  HEMATOLOGICAL:  Negative.   MEDICATIONS PRIOR TO ADMISSION:  1. Coumadin 5 mg daily with 2.5 mg on Monday, Wednesday, and Friday.  2. Hydroxychloroquine 200 mg daily.  3. Cyclobenzaprine 10 mg daily.  4. Hydrocodone p.r.n.  5. Tylenol p.r.n.  6. Multivitamin daily.  7. Nexium 40 mg daily.  8. Has been on prednisone in the past, but has not taken that since      May.   PAST MEDICAL HISTORY:  Significant for lupus diagnosed in 2005.  She has  a history of right lower extremity DVT five years ago treated with  Coumadin since.  She has history of pneumonia with surgery on her chest  in 2005 by Dr. Edwyna Shell.   SOCIAL HISTORY:  She is married and has two sons.  One was killed in  2004 in a car accident.  She is unemployed and lives with her husband.  She has never smoked, and denies alcohol abuse.   FAMILY HISTORY:  Her mother died at age 63 of coronary disease,  diabetes, hypertension.  Father is alive at age 26 and has had heart  disease.  She has five sisters, one with breast cancer and one with  thyroid cancer and two brothers who are alive and well with no history  of heart disease.   PHYSICAL EXAMINATION:  VITAL SIGNS:  Blood pressure 135/85, pulse 80 and  regular, respiratory rate 16 and unlabored.  GENERAL:  She is an obese black female in no distress.  HEENT:  Normocephalic, atraumatic.  Pupils are equal, reactive to light  and accommodation.  Extraocular muscles are intact.  Throat is clear.  NECK:  Shows normal carotid pulses bilaterally.  There are no bruits.  There is no adenopathy or thyromegaly.  CARDIAC:  Shows regular rate and  rhythm with normal S1, S2.  There is no murmur, rub or gallop.  LUNGS:  Clear.  ABDOMEN:  Shows active bowel  sounds.  Abdomen is soft, obese, and  nontender.  No palpable masses or organomegaly.  EXTREMITIES:  Shows no peripheral edema.  Pedal pulses are palpable  bilaterally.  SKIN:  Warm and dry.  NEUROLOGIC:  Alert and oriented x3.  Motor and sensory exams grossly  normal.   IMPRESSION:  Ms. Ertle has left main and severe three-vessel coronary  artery disease presenting with non-ST segment elevation myocardial  infarction and unstable angina.  She has had a significant rise in her  cardiac enzymes with a CK this morning of 1666 and MB 247 with troponin  9.22.  She continued to have chest pain this morning, and I agree with  Dr. Tresa Swanson that the culprit is most likely high grade left circumflex and  obtuse marginal stenosis with slow flow down this vessel.  I agree that  emergent coronary artery bypass graft surgery is the best treatment to  prevent further ischemia and infarction and myocardial damage.  I  discussed the operative procedure with the patient  and her husband including alternatives, benefits, and risks including  but not limited to bleeding, blood transfusion, infection, stroke,  myocardial infarction, graft failure, and death.  She understands and  would like to proceed with surgery.      [name], M.D.  Electronically Signed     BB/MEDQ  D:  09/02/2007  T:  09/02/2007  Job:  [id number]   cc:   [name], M.D.

## 2011-03-20 NOTE — Assessment & Plan Note (Signed)
OFFICE VISIT   [name], [name]  DOB:  06-21-1951                                        September 30, 2007  CHART #:  [id number]   The patient returned to my office today for followup status post  coronary artery bypass graft surgery on 09/02/2007.  She has a history  of lupus.  She had an uncomplicated postoperative course and was  discharged home on Coumadin which she had been on for a previous history  of DVT.  She reports no chest pain or shortness of breath.  She has been  walking short distances.  She is trying to participate in the cardiac  rehab program if she can get that worked out financially.   PHYSICAL EXAMINATION:  Vital signs:  Her blood pressure is 147/74 and  her pulse is 110 and regular.  Respiratory rate is 18, unlabored.  Oxygen saturation on room air is 100%.  General:  She looks well.  Cardiac:  Shows a regular rate and rhythm with normal heart sounds.  Lungs:  Clear.  The chest incision is healing well and the sternum is  stable.  There is no peripheral edema.   Followup chest x-ray shows low lung volumes but the lungs are clear  without pleural effusion.   MEDICATIONS:  Coumadin 5 mg on Tuesday and Thursday and 2.5 mg on the  other days, Nexium 40 mg daily, Glucotrol 5 mg daily, Lopressor 50 mg  b.i.Swanson., Zocor 80 mg daily, aspirin 81 mg daily, Niferex 150 mg daily,  and oxycodone p.r.n. for pain.   IMPRESSION:  Overall, the patient is recovering well following her  surgery.   I encouraged her to continue walking as much as possible.  I told her  she could return to driving a car when she feels comfortable with that.  I asked her not to lift anything heavier than 10  pounds for a total of 3 months from the date of surgery.  She will  continue to follow up with Dr. Tresa Endo and will contact me if she develops  any problems with her incisions.   Evelene Croon, M.Swanson.  Electronically Signed   BB/MEDQ  Swanson:  09/30/2007  T:   09/30/2007  Job:  [id number]   cc:   Nicki [place], M.Swanson.

## 2011-03-23 NOTE — Discharge Summary (Signed)
[place]  Patient:    [name], [name]                    MRN: [id number] Adm. Date:  [id number] Disc. Date: [id number] Attending:  Olene Craven CC:         Lenise Herald, M.D.   Discharge Summary  DATE OF BIRTH:  May 05, 1951.  DISCHARGE DIAGNOSES: 1. Severe microcytic anemia. 2. Iron deficiency anemia. 3. Atypical chest pain. 4. Elevated blood pressure. 5. Gastroesophageal reflux disease.  CONSULTS: 1. Anselmo Rod, M.D., GI. 2. Lennette Bihari, M.D., Cardiology.  PROCEDURES:  Patient had an EGD, which was negative, a colonoscopy showed small internal hemorrhoids, no obvious source of bleed.  Patient had a hemoglobin electrophoresis which was normal, no evidence of hemoglobin C.  DISCHARGE MEDICATIONS:  Multivitamin with iron.  HOSPITAL COURSE:  The patient was admitted on July 24, 2000 after experiencing atypical chest pain at the work place.  She was brought to the emergency room and evaluated, noted to have a severe microcytic anemia with an MCV of 58 and hemoglobin of 5.7.  EKG was nonacute and first initial sets of enzymes were negative.  She was admitted to the hospital for transfusion and further workup.  Initial hematological workup indicated she was sickle test positive, hemoglobin electrophoresis was sent off.  Iron studies indicated and iron store of less than 10 with a ferratin of 4.  She was transfused a total of 4 units hemoglobin rising to a discharge of 10.5, her cardiac enzymes were negative.  She was previously seen by Dr. Jenne Campus at [facility] who has had her scheduled this week for a stress test.  Her hemoglobin electrophoresis came back indicating normal blood pattern.  She is being discharged today in stable condition.  We will follow her up as an outpatient, set her up for vaginal ultrasound to look for fibroids as the possible cause of menorrhagia and her anemia.  If this is negative  and if hemoglobin continues to dwindle, will consider a small-bowel follow-through looking for a missed source.  The patient did receive IV infusion of 1 g of IV iron to replace her iron stores.  The patient is being discharged in stable condition. She wishes to return to work on Monday.  HOSPITAL FOLLOWUP:  The patient is to see Dr. Kern Reap in two weeks time to try to determine diagnosis __________ instead of her outpatient vaginal ultrasound, Gyn referral and a followup and a CBC.  Patient has a scheduled stress cardiolite with Dr. Jenne Campus on September 30. DD:  07/27/00 TD:  07/28/00 Job: [id number]

## 2011-03-23 NOTE — H&P (Signed)
[facility]  Patient:    [name], [name]                      MRN: [id number] Attending:  Maris Berger. Pennie Rushing, M.D. Dictator:   Henreitta Leber, P.A.                         History and Physical  DATE OF BIRTH:                1951/04/04  HISTORY OF PRESENT ILLNESS:   Ms. Votaw is a 60 year old married African-American female, para 2-0-0-2, with a history of uterine fibroids, menorrhagia, and anemia.  The patient underwent a blood transfusion in September of 2001 for anemia which was secondary to her menorrhagia.  During the course of the patients five to seven-day menstrual flow, she will change a Super pad every 15 to 45 minutes with occasional soiling of her clothes. The patient was found to have a normal TSH and prolactin in October of 2001 and results of her pelvic ultrasound (December 2001) revealed four uterine fibroids the largest of which measured 4.9 cm, was anterior and pedunculated. The patient has decided to persue definitive treatment of her symptomatic uterine fibroids in the form of a hysterectomy.  PAST MEDICAL HISTORY:         OB; gravida 2, para 2, both of which were spontaneous vaginal deliveries and were uncomplicated.  GYN; menarche 60 years old.  Periods are as previously describe and occur every 30 to 31 days.  The patient has no history of sexually transmitted diseases or abnormal Pap smear with her most recent Pap smear being October of 2001.  The patient had a normal mammogram in 1999.  Medical; negative.  Surgical; Negative, however, the patient has had a blood transfusion in September of 2001.  FAMILY HISTORY:               Positive for uterine fibroids (sisters), heart disease, breast cancer (sister).  HABITS:                       The patient does not smoke, drink alcohol, or use recreational drugs.  MEDICATIONS:                  Multivitamins.  ALLERGIES:                    ASPIRIN which causes GI  upset.  REVIEW OF SYSTEMS:            Negative except as previously described.  PHYSICAL EXAMINATION:  GENERAL:                      This is an obese African-American female in no acute distress.  HEENT:                        Normal.  Thyroid is not enlarged.  LUNGS:                        Without wheezes, rales, or rhonchi.  HEART:                        Regular rate and rhythm.  BACK:  Without CVA tenderness.  ABDOMEN:                      Bowel sounds are present.  Soft and nontender. There is no organomegaly.  EXTREMITIES:                  Without clubbing, cyanosis, or edema.  PELVIC:                       EGBUS is within normal limits.  Vagina is rugose.  Cervix is nontender without lesions.  Uterus is 12 to 14 weeks size, nontender.  Adnexa without tenderness or masses.  Rectovaginal without tenderness or masses.  IMPRESSION:                   1. Symptomatic uterine fibroids.                               2. Menorrhagia.                               3. H/O anemia.  DISPOSITION:                  A long discussion was held with the patient concerning options for management of her symptomatic uterine fibroids to include expectant management due to the fact that the patient may experience a decrease in the size of her fibroids and consequently symptoms once she experiences menopause, Depo-Lupron therapy, myomectomy, uterine artery embolization, and hysterectomy.  The patient has considered her options and has decided to persue hysterectomy as a definite treatment of her symptoms. The patient also expresses understanding of the risks associated with this procedure to include infection, reaction to anesthesia, bleeding, and damage to adjacent organs.  The patient has consented for a total vaginal hysterectomy and possible abdominal hysterectomy at [facility] on January 01, 2001, at 7:30 a.m. DD:  12/25/00 TD:  12/25/00 Job:  [id number]

## 2011-03-23 NOTE — Op Note (Signed)
NAME:  [name], [name]                       ACCOUNT NO.:  [id number]   MEDICAL RECORD NO.:  [id number]                   PATIENT TYPE:  INP   LOCATION:  3310                                 FACILITY:  [facility]   PHYSICIAN:  Ines Bloomer, M.D.              DATE OF BIRTH:  1951/08/10   DATE OF PROCEDURE:  03/24/2004  DATE OF DISCHARGE:                                 OPERATIVE REPORT   PREOPERATIVE DIAGNOSES:  1. Left fibrothorax.  2. Chronic pleuritis.  3. Trapped left lower lobe.   POSTOPERATIVE DIAGNOSES:  1. Left fibrothorax.  2. Chronic pleuritis.  3. Trapped left lower lobe.   PROCEDURES PERFORMED:  1. Attempted left video-assisted thoracoscopic surgery.  2. Left thoracotomy.  3. Decortication.   SURGEON:  Ines Bloomer, M.D.   FIRST ASSISTANT:  Coral Ceo, P.A.   This patient has chronic left chest pain and dyspnea after having pneumonia  and over a period of time, he continued to have the dyspnea and chest pain  and showed an entrapped left lower lobe on CT scan, was brought to the  operating room for decortication.   A dual-lumen tube was inserted and the patient was turned to the left  lateral thoracotomy position.  Two trocar sites were made in the anterior  and posterior axillary lines at the seventh intercostal space.  Attempt was  made to insert trocars, and there was a marked amount of adhesions at both  places.  In the anterior axillary line we were able to insert a 0 degree  scope and you could see the adhesions, so we decided to convert to a  posterolateral thoracotomy and this was done over the fifth intercostal  space.  The latissimus was partially divided.  The serratus was reflected  anteriorly.  The lesion was seen directly anteriorly and after the  intercostal space was entered by carefully dividing the pleura and  dissecting the pleura off of the upper lobe and the lower lobe after we  dissected first superiorly, freeing the left upper  lobe off the chest wall,  and then medially, freeing it off the mediastinum.  Inferiorly it was  dissected down the posterior gutter, again dissecting the left lower lobe  and superior segment of the left lower lobe off the chest wall and then off  of the pericardium, and the left lower lobe was dissected off the diaphragm.  After this had been dissected off the diaphragm, one area of the lower lobe  had to be divided with an EZ-45 stapler to get it off the diaphragm.  After  this had been done, attention was turned to the left lower lobe and  decortication was done to the left lower lobe by using sharp and blunt  dissection with Kitners, then forceps, to remove the thickened peel off the  left lower lobe.  It was removed off the diaphragmatic surface first and  then off the  superior segment and then the fissure.  There was one large  pseudotumor in the fissure, which was dissected out and sent for culture.  After the left lower lobe all had been completely freed up, attention was  turned to the lingula and the anterior segment, which was involved in the  left upper lobe, and this was dissected free, again freeing off the  thickened peel off the left lower lobe.  The lung was then re-expanded and  re-expanded well.  Two chest tubes were placed through the trocar sites and  then another chest tube site was made posteriorly, and a right angle chest  tube was inserted in the costophrenic gutter.  They were sutured in place  with 0 suture.  The __________ was closed with three paracostals.  The On-Q  was placed in the paravertebral space above and below the incision and  brought out through the incision.  The paracostals were tied down.  The  muscle was closed with #1 Vicryl, the subcutaneous tissue with 2-0 Vicryl,  and Ethicon skin clips.  The patient was returned to the recovery room in  stable condition.                                               Ines Bloomer, M.D.    DPB/MEDQ   D:  03/24/2004  T:  03/26/2004  Job:  [id number]   cc:   Marcelyn Bruins, M.D. [facility]

## 2011-03-23 NOTE — Discharge Summary (Signed)
   NAME:  [name], [name]                       ACCOUNT NO.:  [id number]   MEDICAL RECORD NO.:  [id number]                   PATIENT TYPE:  INP   LOCATION:  2020                                 FACILITY:  [facility]   PHYSICIAN:  Olene Craven, M.D.            DATE OF BIRTH:  1951/09/27   DATE OF ADMISSION:  01/06/2003  DATE OF DISCHARGE:  01/08/2003                                 DISCHARGE SUMMARY   DISCHARGE DIAGNOSES:  1. Pericarditis.  2. Pneumonia.  3. Coronary artery disease.   DISCHARGE MEDICATIONS:  1. Tequin 400 mg p.o. daily x8 days.  2. Mobic 15 mg p.o. daily.  3. Robitussin 10 mL p.o. q.6h. p.r.n.   CONSULTATION:  [facility].   PROCEDURE:  None.   HOSPITAL COURSE:  The patient was admitted on January 06, 2003, for chest pain  and dyspnea.  She was originally admitted by [facility].  She  was found to have left lower lobe pneumonia and pericarditis.  She was not  felt to be, at this time, cardiac in nature.  She was transferred to Dr.  Loney Laurence service.  Her symptoms improved with IV Tequin and starting anti-  inflammatories.  She was discharged in stable condition on January 08, 2003.  Blood cultures were negative.  Cardiac enzymes negative.   FOLLOW UP:  She is to follow up in two weeks' time or if her symptoms did  not improve.                                               Olene Craven, M.D.    DEH/MEDQ  D:  02/22/2003  T:  02/22/2003  Job:  [id number]

## 2011-03-23 NOTE — Discharge Summary (Signed)
NAME:  [name], [name]                       ACCOUNT NO.:  [id number]   MEDICAL RECORD NO.:  [id number]                   PATIENT TYPE:  INP   LOCATION:  2003                                 FACILITY:  [facility]   PHYSICIAN:  Ines Bloomer, M.D.              DATE OF BIRTH:  03/11/1951   DATE OF ADMISSION:  03/24/2004  DATE OF DISCHARGE:  03/30/2004                                 DISCHARGE SUMMARY   PRIMARY ADMITTING DIAGNOSIS:  Shortness of breath.   ADDITIONAL/DISCHARGE DIAGNOSES:  1. Left fibrothorax.  2. Chronic left pleuritis with trapped left lower lobe.  3. History of obesity.  4. History of pneumonia March 2004.  5. History of anemia secondary to blood loss from fibroid tumors requiring     blood transfusions in the past.  6. Vaginal candidiasis.   PROCEDURE PERFORMED:  1. Attempted left video-assisted thoracoscopy.  2. Left thoracotomy with left lung decortication.   HISTORY:  The patient is a 60 year old black female who initially presented  approximately one year ago March 2004 with a pneumonia for which she was  admitted and treated at [facility]. She did well for approximately  one to two months after her discharge but then developed shortness of breath  and left pleuritic chest pain which has progressively worsened. Followup  chest x-rays revealed persistent small left pleural effusion; however, it  was felt that no further treatment was indicated, and this could be  conservatively managed. In November of 2004 when her symptoms had not  improved, she underwent a chest CT scan which showed abnormal soft tissue in  the anterior mediastinum measuring approximately 3 cm with enlarged  bilateral axillary lymph nodes, a small pericardial effusion, and small  loculated left pleural effusion with pseudotumors. At that time, again, she  was admonished to continue conservative management, and this has been  monitored; however, her symptoms have continued to  persist and worsen, and  eventually on Mar 08, 2004, a repeat chest CT was performed which was  relatively unchanged from the previous scan except that her bilateral  axillary lymphadenopathy was slightly increased on the right and decreased  on the left. Because her symptoms have persisted and actually continued to  worsen, she was referred to Dr. Edwyna Shell for further evaluation and treatment.  Dr. Edwyna Shell reviewed her films, and after evaluation, the patient recommended  proceeding with a left VATS and decortication at this time.   HOSPITAL COURSE:  She was admitted to [facility] on Mar 24, 2004  and was taken to the operating room. VATS was initially attempted, and  failing this, she underwent a left thoracotomy with decortication. Multiple  specimens were sent for pathology as well as cultures. Ultimately, her  pathology has been positive only for inflammatory tissue, and all cultures  have been negative. She tolerated the procedure well and was transferred to  the 3300 unit in stable condition. Postoperatively, she  developed a fever up  to 102 with elevation of her white blood cell count to 10,000. She was  placed on IV antibiotics, specifically Maxipime and other lab values were  obtained. A urinalysis as well as blood cultures were all negative. Her  chest x-ray showed evidence of atelectasis bilaterally. She was treated with  aggressive pulmonary toilet measures, and her white count was followed. Her  fever resolved on postoperative day #2, and she was switched to p.o. Avelox.  Her white blood cell count had since trended downward and has stabilized  presently at 4.6. She has otherwise done well postoperatively. Her chest  tubes have been removed in a stepwise fashion, and followup chest x-ray  today showed no evidence of pneumothorax with good aeration bilaterally and  minimal atelectasis. The labs have remained stable with a hemoglobin of 12,  hematocrit 36, white count 4.6,  platelets 253, potassium 3.8 which has been  supplemented, BUN 3, creatinine 0.8. She has been ambulating in the halls  without difficulty. She has been weaned from supplemental oxygen and is  maintaining O2 saturations of greater than 90% on room air. Surgical  incision sites were all healing well. She is tolerated a regular diet and is  having normal bowel and bladder function. Her only other postoperative  complaint has been vaginal yeast infection which developed after the  initiation of antibiotics. She has been treated with a single dose of  Diflucan and appears to be improving. She has been transferred to the floor  at this time and is doing very well. It is felt that if she continues to  remain stable over the next 24 hours and followup chest x-ray on the morning  of Mar 30, 2004 has shown no change. She will be ready for discharge home at  that time.   DISCHARGE MEDICATIONS:  1. Avelox 400 mg q.d. for one week.  2. Tylox one to two q.4-6h. p.r.n. for pain.   DISCHARGE INSTRUCTIONS:  She is to refrain from driving, heavy lifting, or  strenuous activity. She is asked to continue ambulating daily and use her  incentive spirometer. She may shower daily and clean her incisions with soap  and water. She will continue her same preoperative diet.   DISCHARGE FOLLOWUP:  She is asked to see Dr. Edwyna Shell back in the office in  one week and has a chest x-ray at [facility] one hour  prior to this appointment. She will also follow up with Dr. Shelle Iron and Dr.  Katrinka Blazing as directed. If she has problems or questions in the interim, she  asked to call our office immediately.      Coral Ceo, P.A.                        Ines Bloomer, M.D.    GC/MEDQ  D:  03/29/2004  T:  03/31/2004  Job:  [id number]   cc:   Marcelyn Bruins, M.D. [facility]   Dario Guardian, M.D.  [address]  [place]  [place] [address]  Fax: [phone]

## 2011-03-23 NOTE — Procedures (Signed)
[place]. [facility]  Patient:    [name], [name]                    MRN: [id number] Proc. Date: 07/26/00 Adm. Date:  [id number] Disc. Date: [id number] Attending:  Olene Craven CC:         Kern Reap, M.D.  Lenise Herald, M.D.   Procedure Report  DATE OF BIRTH:  [birth year].  PROCEDURE PERFORMED:  Colonoscopy.  ENDOSCOPIST:  Anselmo Rod, M.D.  INSTRUMENT USED:  Olympus video colonoscope.  INDICATION FOR PROCEDURE:  Guaiac-positive stool and severe anemia in a 60 year old black female; rule out colonic polyps, masses, hemorrhoids, etc.  PREPROCEDURE PREPARATION:  Informed consent was procured from the patient. The patient was fasted for eight hours prior to the procedure after being prepped with a bottle of magnesium citrate and a gallon of NuLytely the night prior to the procedure.  PREPROCEDURE PHYSICAL  VITAL SIGNS:  Patient had stable vital signs.  NECK:  Supple.  CHEST:  Clear to auscultation.  S1 and S2 regular.  ABDOMEN:  Soft with normal abdominal bowel sounds.  DESCRIPTION OF PROCEDURE:  The patient was placed in the left lateral decubitus position and sedated with Demerol 70 mg and Versed 7 mg intravenously.  Once the patient was adequately sedate and maintained on low-flow oxygen and continuous cardiac monitoring, the Olympus video colonoscope was advanced from the rectum to the cecum and terminal ileum without difficulty.  Entire colonic mucosa appeared healthy with normal vascular pattern.  No erosions, ulcerations, masses, polyps or diverticula were seen.  IMPRESSION:  Normal colonoscopy except for small non-bleeding internal hemorrhoid.  RECOMMENDATION:  A hematologic workup should be completed as discussed with Dr. Kern Reap.  Further recommendation will be made by him. DD:  07/26/00 TD:  07/29/00 Job: [id number]

## 2011-03-23 NOTE — Op Note (Signed)
[place]. [facility]  Patient:    [name], [name]                    MRN: [id number] Proc. Date: 07/25/00 Adm. Date:  [id number] Attending:  Olene Craven CC:         Kern Reap, M.D.  Bing Ree, M.D. [facility]   Operative Report  DATE OF BIRTH:  16-Aug-1951  PROCEDURE:  Esophagogastroduodenoscopy.  ENDOSCOPIST:  Anselmo Rod, M.D.  INSTRUMENTS USED:  Olympus video panendoscope.  INDICATIONS:  Severe anemia with guaiac positive stools in 60 year old black female, rule out peptic ulcer disease, esophagitis, gastritis, etc.  INFORMED CONSENT:  Informed consent was procured from the patient.  The patient was fasted for eight hours prior to the procedure.  The patient received 2 units of packed cells prior to procedure because she had severe anemia with a hemoglobin of 5.7 grams per deciliter.  PREPROCEDURE PHYSICAL EXAMINATION:  VITAL SIGNS:  The patient had stable vital signs.  NECK:  Supple.  CHEST:  Clear to auscultation, S1, S2 regular.  No murmur, rub or gallop, rales, rhonchi or wheezing.  ABDOMEN:  Soft with normal abdominal bowel sounds.  No hepatosplenomegaly, no masses palpable.  DESCRIPTION OF THE PROCEDURE:  The patient was placed in the left lateral decubitus position, and sedated with 30 mg of Demerol and 3 mg of Versed intravenously.  Once the patient was adequately sedated and maintained on low flow oxygen, continuous cardiac monitoring, the Olympus video panendoscope was advanced through the mouthpiece, over the tongue, into the esophagus under direct vision.  The entire esophagus appeared normal with healthy appearing gastric mucosa and proximal small bowel.  No ulcers, erosions or masses were seen.  The patient tolerated the procedure well without complications.  IMPRESSION: 1. Normal esophagogastroduodenoscopy  RECOMMENDATIONS: 1. Proceed with colonoscopy tomorrow. 2. Serial  CBCs. 3. Transfuse 2 more units of packed red blood cells today.  I discussed with    Dr. Barbee Shropshire. 4. Further recommendations will be made after colonoscopy has been done. DD:  07/25/00 TD:  07/26/00 Job: [id number]

## 2011-03-23 NOTE — H&P (Signed)
NAME:  [name], [name]                       ACCOUNT NO.:  [id number]   MEDICAL RECORD NO.:  [id number]                   PATIENT TYPE:  INP   LOCATION:  NA                                   FACILITY:  [facility]   PHYSICIAN:  Ines Bloomer, M.D.              DATE OF BIRTH:  [birth year]   DATE OF ADMISSION:  03/24/2004  DATE OF DISCHARGE:                                HISTORY & PHYSICAL   PRIMARY PHYSICIAN:  Dario Guardian, M.D.   PULMONOLOGIST:  Marcelyn Bruins, M.D. [facility]   CHIEF COMPLAINT:  Shortness of breath with left pleuritic chest pain times  approximately one year.   HISTORY OF PRESENT ILLNESS:  [name] is a 60 year old African American  female who initially presented with pneumonia approximately 12 months ago in  March 2004.  She was treated at [facility].  She did well for  approximately 1-2 months but then began to develop increasing shortness of  breath as well as left pleuritic chest pain.  Followup chest x-ray showed  persistent small left pleural effusions; however, at that time it was felt  that watchful waiting was appropriate.  Eventually, however, she did require  a chest CT scan which was done in November 2004.  This showed abnormal soft  tissue in the anterior mediastinum, measuring approximately 3-cm, enlarged  bilateral axillary lymph nodes, small pericardial effusion, and small  loculated left pleural effusion with pseudotumors.  Watchful waiting was  once again recommended.  However, her symptoms have continued to persist and  on Mar 08, 2004, a repeat chest CT scan was done which was relatively  unchanged from the previous scan, except that her enlarged bilateral  axillary lymph nodes showed a slight increase on the right and decrease on  the left.  Based on her continued symptoms of shortness of breath and  pleuritic chest pain, it was felt that she should be evaluated by Dr. Edwyna Shell  from [facility] regarding further management.  [name] was seen  by Dr. Edwyna Shell  on Mar 14, 2004.  He felt that due to her symptomatology of pain, shortness  of breath, and the fact that she had evidence of a left lower lobe  loculation, that she would benefit from a left video-assisted thoracic  surgery with decortication.  After discussing the risks, benefits, and  alternatives with the patient, she did agree to proceed.   As stated before, the patient does have shortness of breath which is worse  with activity.  She is able to perform her activities of daily living.  She  does report having to sleep on several pillows at night.  She also reports  night sweats which are occurring more frequently.  She also has pleuritic  left sided chest pain.  Otherwise she denies palpitations, hemoptysis,  excessive sputum production, nausea, vomiting, diarrhea, dysuria, dysphagia,  claudication symptoms, syncope, presyncope, or hearing or visual changes.  She does  report occasional constipation.  She also gets occasional mild  lower extremity swelling with prolonged standing.   PAST MEDICAL HISTORY:  1. Obesity.  2. Pneumonia in March 2004.  3. History of anemia secondary to blood loss due to fibroid tumors.  She     also has postoperative anemia following her hysterectomy.  She did     receive blood transfusions with each incident.   PAST SURGICAL HISTORY:  Hysterectomy in February 2002.   MEDICATIONS:  Advil as needed.   ALLERGIES:  1. She develops a rash and hives with MOBIC.  2. She develops GI upset with ASPIRIN.   REVIEW OF SYSTEMS:  Please refer to history of present illness for pertinent  positives and negatives.  She does wear glasses.   FAMILY HISTORY:  Her mother died at age 37 and had a history of coronary  artery disease and diabetes mellitus.  Her father has a history of coronary  artery disease and is alive at age 30.  Her sister was diagnosed with breast  cancer at age 37 but is currently doing well.   SOCIAL HISTORY:  She is married  with one son who is 54 years old.  She had a  36 year old son who died last year in a motor vehicle accident.  She  currently works in Set [employer].  She denies alcohol use.  She has never  used tobacco products.   PHYSICAL EXAMINATION:  VITAL SIGNS:  Her blood pressure 122/88.  Her pulse  84, respirations 18.  GENERAL APPEARANCE:  This is a 60 year old African American female who is  very pleasant.  She is alert, cooperative, and in no acute distress.  HEENT:  Her head is normocephalic and atraumatic.  Her pupils are equal,  round, and reactive to light and accommodation.  Her sclerae are nonicteric.  She does wear complete upper and lower dentures.  Her oral mucous membranes  are pink and moist.  No lesions are noted.  NECK:  Supple.  No JVD, lymphadenopathy, or thyromegaly is noted.  She does  have 2+ carotid pulses.  No carotid bruits were auscultated.  CHEST:  Her chest is symmetrical on inspiration.  Her lung sounds are  diminished and slightly coarse in the left lower lobe, otherwise they are  clear.  CARDIAC:  Her heart has a regular rate and rhythm.  No murmur, rub, or  gallop was noted.  ABDOMEN:  Soft, nontender, nondistended.  It is obese.  She does have  normoactive bowel sounds x 4 quadrants.  No masses were palpated.  No  hepatosplenomegaly was noted.  GU/RECTAL:  These exams were deferred.  EXTREMITIES:  Her extremities are warm without edema.  She had 2+ radial and  dorsalis pedis pulses bilaterally.  NEUROLOGIC:  Her neurologic exam was grossly intact.  She is alert and  oriented  x 4.  Her speech is clear.  Her gait is steady.  Her bilateral  upper and lower muscle strength is 5/5.   ASSESSMENT/PLAN:  [name] is a 60 year old African American female with  complaints of shortness of breath and left pleuritic chest pain.  She has a  persistent loculated left lower lobe effusion.  She will be electively admitted to [facility], on Mar 24, 2004, where she  will undergo a  left VATS with decortication.  Dr. Edwyna Shell has seen and evaluated the patient  prior to this admission and has explained the risks and benefits of the  procedure, and the patient has  agreed to proceed.      Jerold Coombe, P.A.                  Ines Bloomer, M.D.    AWZ/MEDQ  D:  03/21/2004  T:  03/21/2004  Job:  [id number]   cc:   patient's hospital chart   Dario Guardian, M.D.  [address]  [place]  [place] [address]  Fax: [phone]   Marcelyn Bruins, M.D. [facility]

## 2011-03-23 NOTE — Op Note (Signed)
[facility]  Patient:    MADDISON, KILNER                    MRN: [id number] Proc. Date: 01/01/01 Adm. Date:  [id number] Attending:  Dierdre Forth Pearline                           Operative Report  PREOPERATIVE DIAGNOSES:         1. Symptomatic uterine fibroids.                                 2. Menorrhagia.                                 3. Anemia.  POSTOPERATIVE DIAGNOSES:        1. Symptomatic uterine fibroids.                                 2. Menorrhagia.                                 3. Anemia.  OPERATION:                      Total vaginal hysterectomy, uterine morcellation.  SURGEON:                        Vanessa P. Pennie Rushing, M.D.  FIRST ASSISTANT:                Henreitta Leber, P.A.-C  ANESTHESIA:                     General orotracheal.  ESTIMATED BLOOD LOSS:           500 cc  COMPLICATIONS:                  None.  FINDINGS:                       The uterus was enlarged to approximately 16 weeks size with multiple myomata.  The tubes and ovaries appeared within normal limits.  DESCRIPTION OF PROCEDURE:       The patient was taken to the operating room after appropriate identification and placed on the operating table.  After the attainment of adequate general anesthesia, she was placed in the lithotomy position.  The perineum and vagina were prepped with multiple layers of Betadine and draped as a sterile field.  A Foley catheter was inserted into the bladder under sterile conditions and connected to straight drainage.  A weighted speculum was placed in the posterior vagina and Lahey tenaculum placed on the cervix.  The cervical vaginal mucosa was injected with a dilute solution of Pitressin.  The cervix was circumscribed.  the anterior vaginal mucosa was dissected off the anterior cervix and the anterior peritoneal cavity entered.  The posterior peritoneum was entered sharply and the uterosacral ligaments on the right  and left side clamped, cut and suture ligated.  The paracervical tissues were clamped, cut and suture ligated.  The uterosacral ligament sutures had been held.  The parametrial tissues were clamped cut and suture  ligated.  At that time, an attempt was made to invert the uterus and it was clear that morcellation of the uterus would be required in order for it to be removed vaginally.  The cervix was then cored away from the center of the uterus which allowed some decrease in the diameter of the uterus.  This, however, was not adequate and removal of the panuterine fibroids was required prior to the ability to reach the upper pedicles on the right and left sides.  The upper pedicle on the left side was then clamped, cut, tied with a free tie and suture ligated.  The pedicle on the right side could still not be identified until additional myomas were removed sharply. Once the uterus could be collapsed to identify the upper pedicle on the right side.  It was clamped in two separate clamps, cut, tied with a free tie and then suture ligated.  Hemostasis was achieved with this maneuver and noted to be adequate.   The remainder of the uterus was removed from the operative field.  Hemostasis was achieved with figure-of-eight sutures in the right vaginal cuff and hemostasis noted to be adequate.  A McCall culdoplasty suture was placed in the posterior cul-de-sac incorporating the uterosacral ligaments on either side and the intervening posterior peritoneum.  The uterosacral ligaments which had been held were then placed into the anterior vaginal cuff and posterior vaginal cuff and tied down to form the vaginal angles on either side.  The remainder of the vaginal cuff was closed in a single layer incorporating anterior vaginal mucosa, anterior peritoneum, posterior peritoneum and posterior vaginal mucosa in interrupted figure-of-eight sutures.  Once this had been performed, hemostasis was noted to be  adequate, and the [facility] culdoplasty suture tied down.  All sutures in this procedure were 0 Vicryl.  A vaginal pack was placed in the vagina and all instruments removed from the vagina.  The patient was awakened from general anesthesia and taken to the recovery room in satisfactory condition having tolerated the procedure well with sponge and instrument counts correct. DD:  01/01/01 TD:  01/01/01 Job: [id number]

## 2011-03-23 NOTE — Consult Note (Signed)
[place]  Patient:    [name], [name]                    MRN: [id number] Proc. Date: 07/24/00 Adm. Date:  [id number] Attending:  Olene Craven CC:         Kern Reap, M.D.  Roxan Diesel, M.D.   Consultation Report  DATE OF BIRTH:  [birth year]  REFERRING PHYSICIAN:  Kern Reap, M.D.  REASON FOR CONSULTATION:  Severe microcytic anemia and atypical chest pain in a 60 year old black female to rule out a GI source of bleeding.  ASSESSMENT: 1. Severe anemia with hemoglobin of 5.7 g/dl with trace guaiac-positive    stools. Rule out peptic ulcer disease versus colonic source of    gastrointestinal bleeding. 2. Hypertension. 3. Hyperlipidemia. 4. History of gastroesophageal reflux disease, doing well on Nexium.  RECOMMENDATIONS: 1. EGD in a.m. 2. Colonoscopy if EGD is unrevealing. 3. Serial CBCs. 4. Transfuse to keep the hematocrit above 25%. 5. PPI of choice. 6. No nonsteroidals. (This patient has a known allergy to ASPIRIN.)  HISTORY OF PRESENT ILLNESS:  [name] is a 60 year old black female who presents to the emergency room today with recurrent chest pain which has been ongoing intermittently for the last month. She woke up early this morning with chest pain. She denies any nausea, vomiting, bright red blood per rectum, or melena. Her appetite has been stable. There is no nausea, vomiting, diarrhea, or constipation. No history of ulcers, jaundice, colitis, blood transfusions, or tattoos. She denies any genitourinary complaints. She has some shortness of breath with exertion but denies any other cardiorespiratory problems, except for the chest pain as mentioned above. She has no history of headache, dizziness, syncope. No ENT or dental problems. Her weight has been stable. There are no musculoskeletal, vascular, or allergic problems.  ALLERGIES:  She has a known allergy to ASPIRIN seems to cause GI  upset.  PAST MEDICAL HISTORY: 1. History of hyperlipidemia. 2. Borderline hypertension. 3. Reflux.  CURRENT MEDICATIONS:  Nexium one p.o. q.d.  SOCIAL HISTORY:  The patient is married and has two children. [place] She denies the use of alcohol, tobacco, or drugs. 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  FAMILY HISTORY:  The patient has one sister who has had breast cancer and one sister with thyroid cancer. The rest of the brothers and sisters are healthy. She has several siblings. Her father died at 39 of complications of heart disease and diabetes. He also had hypertension. Her mother is 26 and heart disease.  REVIEW OF SYSTEMS: 1. Chest pain. 2. No diarrhea or constipation. No GI bleeding.  PHYSICAL EXAMINATION:  GENERAL:  Reveals a pleasant, middle-aged, black female somewhat obese but in no acute distress laying comfortably on the stretcher.  VITAL SIGNS:  Blood pressure of 112/62; heart rate of 90 per minute, regular; respiratory rate of 20 per minute; temperature 98.8.  HEENT:  NCAT. PERRLA. EOMI. ______ .  NECK:  Supple. No JVD, thyromegaly, or lymphadenopathy.  CHEST:  Clear to auscultation. S1/S2 regular, a flow murmur is present. No rales, rhonchi, or wheezing.  ABDOMEN:  Soft with minimal epigastric tenderness on palpation with no guarding or rebound or rigidity. No hepatosplenomegaly. No masses palpable.  RECTAL:  Reveals guaiac-positive brown stool with no other masses palpable on digital examination.  LABORATORY DATA:  Reveals a hemoglobin of 5.7 g/dl, with hematocrit 14.7,  MCV 58.7, platelets 228,000, with 66% neutrophils, 23% lymphocytes, 7% monocytes, and 1% eosinophils. Urinalysis showed about 50 red blood cells per high power field (the patient is on her menstrual cycle). PT 14.6, with an INR of 1.3, PTT 27. Magnesium 1.8, sodium 145, potassium 3.4, chloride 106, BUN 9, glucose 98. A pH  7.3, pCO2 43.2, bicarb 27. CK-MBs were essentially normal.  PLAN:  Considering this data, plans are to do an EGD after transfusing the patient today. Further recommendations will be made once this has been done. DD:  07/25/00 TD:  07/25/00 Job: [id number]

## 2011-06-18 ENCOUNTER — Encounter (HOSPITAL_BASED_OUTPATIENT_CLINIC_OR_DEPARTMENT_OTHER): Payer: Medicare Other | Admitting: Oncology

## 2011-06-18 ENCOUNTER — Other Ambulatory Visit (HOSPITAL_COMMUNITY): Payer: Self-pay | Admitting: Oncology

## 2011-06-18 DIAGNOSIS — M329 Systemic lupus erythematosus, unspecified: Secondary | ICD-10-CM

## 2011-06-18 DIAGNOSIS — Z86718 Personal history of other venous thrombosis and embolism: Secondary | ICD-10-CM

## 2011-06-18 DIAGNOSIS — Z7901 Long term (current) use of anticoagulants: Secondary | ICD-10-CM

## 2011-06-21 LAB — PROTEIN S, ANTIGEN, FREE: Protein S Ag, Free: 58 % normal (ref 50–147)

## 2011-06-21 LAB — D-DIMER, QUANTITATIVE: D-Dimer, Quant: 0.47 ug/mL-FEU (ref 0.00–0.48)

## 2011-06-21 LAB — PROTEIN S ACTIVITY: Protein S Activity: 48 % — ABNORMAL LOW (ref 69–129)

## 2011-08-14 LAB — PROTIME-INR
INR: 2.2 — ABNORMAL HIGH
INR: 2.6 — ABNORMAL HIGH
INR: 2.7 — ABNORMAL HIGH
INR: 2.8 — ABNORMAL HIGH
INR: 2.8 — ABNORMAL HIGH
Prothrombin Time: 25.2 — ABNORMAL HIGH
Prothrombin Time: 29 — ABNORMAL HIGH
Prothrombin Time: 29.7 — ABNORMAL HIGH
Prothrombin Time: 30.3 — ABNORMAL HIGH
Prothrombin Time: 30.4 — ABNORMAL HIGH

## 2011-08-14 LAB — BASIC METABOLIC PANEL
BUN: 24 — ABNORMAL HIGH
BUN: 27 — ABNORMAL HIGH
BUN: 29 — ABNORMAL HIGH
CO2: 26
CO2: 26
CO2: 28
Calcium: 8.9
Calcium: 8.9
Calcium: 9
Chloride: 100
Chloride: 100
Chloride: 102
Creatinine, Ser: 0.91
Creatinine, Ser: 0.92
Creatinine, Ser: 1.25 — ABNORMAL HIGH
GFR calc Af Amer: 54 — ABNORMAL LOW
GFR calc Af Amer: >60
GFR calc Af Amer: >60
GFR calc non Af Amer: 44 — ABNORMAL LOW
GFR calc non Af Amer: >60
GFR calc non Af Amer: >60
Glucose, Bld: 129 — ABNORMAL HIGH
Glucose, Bld: 84
Glucose, Bld: 95
Potassium: 3.9
Potassium: 4.1
Potassium: 4.1
Sodium: 137
Sodium: 138
Sodium: 138

## 2011-08-14 LAB — CBC
HCT: 25 — ABNORMAL LOW
HCT: 25.6 — ABNORMAL LOW
HCT: 26.3 — ABNORMAL LOW
Hemoglobin: 8.4 — ABNORMAL LOW
Hemoglobin: 8.5 — ABNORMAL LOW
Hemoglobin: 8.9 — ABNORMAL LOW
MCHC: 33.2
MCHC: 33.7
MCHC: 33.7
MCV: 86
MCV: 86.2
MCV: 86.7
Platelets: 184
Platelets: 222
Platelets: 267
RBC: 2.91 — ABNORMAL LOW
RBC: 2.95 — ABNORMAL LOW
RBC: 3.06 — ABNORMAL LOW
RDW: 14.9 — ABNORMAL HIGH
RDW: 15 — ABNORMAL HIGH
RDW: 15.7 — ABNORMAL HIGH
WBC: 11.1 — ABNORMAL HIGH
WBC: 14.6 — ABNORMAL HIGH
WBC: 17 — ABNORMAL HIGH

## 2011-08-14 LAB — B-NATRIURETIC PEPTIDE (CONVERTED LAB): Pro B Natriuretic peptide (BNP): 858 — ABNORMAL HIGH

## 2011-08-15 LAB — BASIC METABOLIC PANEL WITH GFR
BUN: 16
CO2: 28
Calcium: 8.9
Chloride: 100
Creatinine, Ser: 1.12
GFR calc non Af Amer: 50 — ABNORMAL LOW
Glucose, Bld: 127 — ABNORMAL HIGH
Potassium: 4.1
Sodium: 139

## 2011-08-15 LAB — DIFFERENTIAL
Basophils Absolute: 0
Basophils Relative: 0
Eosinophils Absolute: 0
Eosinophils Relative: 0
Lymphocytes Relative: 21
Lymphs Abs: 1.6
Monocytes Absolute: 0.6
Monocytes Relative: 8
Neutro Abs: 5.3
Neutrophils Relative %: 71

## 2011-08-15 LAB — POCT I-STAT 3, ART BLOOD GAS (G3+)
Acid-Base Excess: 1
Acid-Base Excess: 1
Acid-Base Excess: 14 — ABNORMAL HIGH
Acid-base deficit: 4 — ABNORMAL HIGH
Bicarbonate: 21.5
Bicarbonate: 24
Bicarbonate: 26.2 — ABNORMAL HIGH
Bicarbonate: 26.8 — ABNORMAL HIGH
Bicarbonate: 43.3 — ABNORMAL HIGH
O2 Saturation: 100
O2 Saturation: 100
O2 Saturation: 100
O2 Saturation: 98
O2 Saturation: 99
Operator id: 256891
Operator id: 274841
Operator id: 274841
Operator id: 3390
Operator id: 3390
Patient temperature: 31.5
Patient temperature: 36.1
Patient temperature: 36.2
Patient temperature: 37.1
TCO2: 23
TCO2: 25
TCO2: 27
TCO2: 28
TCO2: 46
pCO2 arterial: 26.7 — ABNORMAL LOW
pCO2 arterial: 41.4
pCO2 arterial: 44
pCO2 arterial: 45
pCO2 arterial: 85.4
pH, Arterial: 7.301 — ABNORMAL LOW
pH, Arterial: 7.308 — ABNORMAL LOW
pH, Arterial: 7.38
pH, Arterial: 7.409 — ABNORMAL HIGH
pH, Arterial: 7.54 — ABNORMAL HIGH
pO2, Arterial: 119 — ABNORMAL HIGH
pO2, Arterial: 120 — ABNORMAL HIGH
pO2, Arterial: 259 — ABNORMAL HIGH
pO2, Arterial: 309 — ABNORMAL HIGH
pO2, Arterial: 310 — ABNORMAL HIGH

## 2011-08-15 LAB — CREATININE, SERUM
Creatinine, Ser: 1.14
GFR calc Af Amer: 60 — ABNORMAL LOW
GFR calc non Af Amer: 49 — ABNORMAL LOW

## 2011-08-15 LAB — PROTIME-INR
INR: 1.8 — ABNORMAL HIGH
INR: 1.9 — ABNORMAL HIGH
INR: 2 — ABNORMAL HIGH
INR: 2 — ABNORMAL HIGH
INR: 2 — ABNORMAL HIGH
Prothrombin Time: 21.6 — ABNORMAL HIGH
Prothrombin Time: 22.2 — ABNORMAL HIGH
Prothrombin Time: 22.9 — ABNORMAL HIGH
Prothrombin Time: 23 — ABNORMAL HIGH
Prothrombin Time: 23.3 — ABNORMAL HIGH

## 2011-08-15 LAB — I-STAT 8, (EC8 V) (CONVERTED LAB)
Acid-Base Excess: 3 — ABNORMAL HIGH
BUN: 10
Bicarbonate: 27.8 — ABNORMAL HIGH
Chloride: 105
Glucose, Bld: 129 — ABNORMAL HIGH
HCT: 48 — ABNORMAL HIGH
Hemoglobin: 16.3 — ABNORMAL HIGH
Operator id: 198171
Potassium: 4.2
Sodium: 137
TCO2: 29
pCO2, Ven: 42.6 — ABNORMAL LOW
pH, Ven: 7.423 — ABNORMAL HIGH

## 2011-08-15 LAB — BASIC METABOLIC PANEL
BUN: 11
BUN: 5 — ABNORMAL LOW
CO2: 26
CO2: 26
Calcium: 8 — ABNORMAL LOW
Calcium: 8.6
Chloride: 103
Chloride: 109
Creatinine, Ser: 0.86
Creatinine, Ser: 1.15
GFR calc Af Amer: 59 — ABNORMAL LOW
GFR calc Af Amer: >60
GFR calc non Af Amer: 49 — ABNORMAL LOW
GFR calc non Af Amer: >60
Glucose, Bld: 152 — ABNORMAL HIGH
Glucose, Bld: 92
Potassium: 4
Potassium: 4.2
Sodium: 138
Sodium: 141

## 2011-08-15 LAB — POCT I-STAT 3, VENOUS BLOOD GAS (G3P V)
Acid-base deficit: 1
Bicarbonate: 23.7
O2 Saturation: 84
Operator id: 3390
TCO2: 25
pCO2, Ven: 30.2 — ABNORMAL LOW
pH, Ven: 7.479 — ABNORMAL HIGH
pO2, Ven: 33

## 2011-08-15 LAB — COMPREHENSIVE METABOLIC PANEL
ALT: 22
AST: 78 — ABNORMAL HIGH
Albumin: 3.5
Alkaline Phosphatase: 91
BUN: 8
CO2: 28
Calcium: 9.4
Chloride: 101
Creatinine, Ser: 0.8
GFR calc Af Amer: >60
GFR calc non Af Amer: >60
Glucose, Bld: 119 — ABNORMAL HIGH
Potassium: 4.1
Sodium: 139
Total Bilirubin: 0.5
Total Protein: 8.4 — ABNORMAL HIGH

## 2011-08-15 LAB — CK TOTAL AND CKMB (NOT AT ARMC)
CK, MB: 141.1 — ABNORMAL HIGH
CK, MB: 161.2 — ABNORMAL HIGH
CK, MB: 68.2 — ABNORMAL HIGH
Relative Index: 14.8 — ABNORMAL HIGH
Relative Index: 5.7 — ABNORMAL HIGH
Relative Index: 9.5 — ABNORMAL HIGH
Total CK: 1086 — ABNORMAL HIGH
Total CK: 1196 — ABNORMAL HIGH
Total CK: 1488 — ABNORMAL HIGH

## 2011-08-15 LAB — CBC
HCT: 24 — ABNORMAL LOW
HCT: 25.1 — ABNORMAL LOW
HCT: 26.5 — ABNORMAL LOW
HCT: 27.8 — ABNORMAL LOW
HCT: 27.9 — ABNORMAL LOW
HCT: 37.5
HCT: 38.7
HCT: 42.7
Hemoglobin: 12.4
Hemoglobin: 12.8
Hemoglobin: 14.2
Hemoglobin: 8 — ABNORMAL LOW
Hemoglobin: 8.4 — ABNORMAL LOW
Hemoglobin: 8.8 — ABNORMAL LOW
Hemoglobin: 9.2 — ABNORMAL LOW
Hemoglobin: 9.3 — ABNORMAL LOW
MCHC: 33
MCHC: 33.1
MCHC: 33.1
MCHC: 33.2
MCHC: 33.2
MCHC: 33.3
MCHC: 33.5
MCHC: 33.5
MCV: 85
MCV: 85.3
MCV: 85.5
MCV: 85.6
MCV: 85.7
MCV: 85.8
MCV: 86.2
MCV: 86.6
Platelets: 105 — ABNORMAL LOW
Platelets: 108 — ABNORMAL LOW
Platelets: 117 — ABNORMAL LOW
Platelets: 117 — ABNORMAL LOW
Platelets: 124 — ABNORMAL LOW
Platelets: 135 — ABNORMAL LOW
Platelets: 159
Platelets: 277
RBC: 2.77 — ABNORMAL LOW
RBC: 2.94 — ABNORMAL LOW
RBC: 3.09 — ABNORMAL LOW
RBC: 3.23 — ABNORMAL LOW
RBC: 3.25 — ABNORMAL LOW
RBC: 4.4
RBC: 4.51
RBC: 5.02
RDW: 14.6 — ABNORMAL HIGH
RDW: 14.7 — ABNORMAL HIGH
RDW: 14.8 — ABNORMAL HIGH
RDW: 15 — ABNORMAL HIGH
RDW: 15.1 — ABNORMAL HIGH
RDW: 15.1 — ABNORMAL HIGH
RDW: 15.1 — ABNORMAL HIGH
RDW: 15.1 — ABNORMAL HIGH
WBC: 12.4 — ABNORMAL HIGH
WBC: 12.7 — ABNORMAL HIGH
WBC: 14.9 — ABNORMAL HIGH
WBC: 16.2 — ABNORMAL HIGH
WBC: 17.2 — ABNORMAL HIGH
WBC: 19.9 — ABNORMAL HIGH
WBC: 7.5
WBC: 8.8

## 2011-08-15 LAB — POCT I-STAT 4, (NA,K, GLUC, HGB,HCT)
Glucose, Bld: 107 — ABNORMAL HIGH
Glucose, Bld: 119 — ABNORMAL HIGH
Glucose, Bld: 119 — ABNORMAL HIGH
Glucose, Bld: 137 — ABNORMAL HIGH
Glucose, Bld: 174 — ABNORMAL HIGH
HCT: 24 — ABNORMAL LOW
HCT: 26 — ABNORMAL LOW
HCT: 32 — ABNORMAL LOW
HCT: 35 — ABNORMAL LOW
HCT: 39
Hemoglobin: 10.9 — ABNORMAL LOW
Hemoglobin: 11.9 — ABNORMAL LOW
Hemoglobin: 13.3
Hemoglobin: 8.2 — ABNORMAL LOW
Hemoglobin: 8.8 — ABNORMAL LOW
Operator id: 274841
Operator id: 3390
Operator id: 3390
Operator id: 3390
Operator id: 3390
Potassium: 3.4 — ABNORMAL LOW
Potassium: 3.6
Potassium: 4.5
Potassium: 4.8
Potassium: 6.2 — ABNORMAL HIGH
Sodium: 134 — ABNORMAL LOW
Sodium: 137
Sodium: 138
Sodium: 139
Sodium: 141

## 2011-08-15 LAB — LIPID PANEL
Cholesterol: 244 — ABNORMAL HIGH
HDL: 59
LDL Cholesterol: 176 — ABNORMAL HIGH
Total CHOL/HDL Ratio: 4.1
Triglycerides: 46
VLDL: 9

## 2011-08-15 LAB — APTT
aPTT: 39 — ABNORMAL HIGH
aPTT: 40 — ABNORMAL HIGH
aPTT: 63 — ABNORMAL HIGH

## 2011-08-15 LAB — COMPREHENSIVE METABOLIC PANEL WITH GFR
ALT: 26
AST: 126 — ABNORMAL HIGH
Albumin: 3.1 — ABNORMAL LOW
Alkaline Phosphatase: 85
BUN: 3 — ABNORMAL LOW
CO2: 24
Calcium: 8.8
Chloride: 102
Creatinine, Ser: 0.79
GFR calc non Af Amer: >60
Glucose, Bld: 113 — ABNORMAL HIGH
Potassium: 3.9
Sodium: 135
Total Bilirubin: 0.7
Total Protein: 7.6

## 2011-08-15 LAB — TSH: TSH: 1.682

## 2011-08-15 LAB — CROSSMATCH
ABO/RH(D): A POS
Antibody Screen: NEGATIVE

## 2011-08-15 LAB — I-STAT EC8
Acid-base deficit: 1
BUN: 10
Bicarbonate: 23.3
Chloride: 104
Glucose, Bld: 132 — ABNORMAL HIGH
HCT: 27 — ABNORMAL LOW
Hemoglobin: 9.2 — ABNORMAL LOW
Operator id: 193041
Potassium: 3.8
Sodium: 141
TCO2: 24
pCO2 arterial: 37.3
pH, Arterial: 7.403 — ABNORMAL HIGH

## 2011-08-15 LAB — HEMOGLOBIN AND HEMATOCRIT, BLOOD
HCT: 25.1 — ABNORMAL LOW
Hemoglobin: 8.3 — ABNORMAL LOW

## 2011-08-15 LAB — TROPONIN I: Troponin I: 3.9

## 2011-08-15 LAB — BLOOD GAS, ARTERIAL
Acid-Base Excess: 0.2
Bicarbonate: 24.9 — ABNORMAL HIGH
O2 Content: 2
O2 Saturation: 98.5
Patient temperature: 98.6
TCO2: 26.3
pCO2 arterial: 44.4
pH, Arterial: 7.368
pO2, Arterial: 114 — ABNORMAL HIGH

## 2011-08-15 LAB — CARDIAC PANEL(CRET KIN+CKTOT+MB+TROPI)
CK, MB: 247 — ABNORMAL HIGH
Relative Index: 14.8 — ABNORMAL HIGH
Total CK: 1666 — ABNORMAL HIGH
Troponin I: 9.22

## 2011-08-15 LAB — POCT I-STAT CREATININE
Creatinine, Ser: 0.9
Operator id: 198171

## 2011-08-15 LAB — MAGNESIUM
Magnesium: 1.9
Magnesium: 2.3
Magnesium: 2.6 — ABNORMAL HIGH

## 2011-08-15 LAB — HEPARIN LEVEL (UNFRACTIONATED)
Heparin Unfractionated: 0.27 — ABNORMAL LOW
Heparin Unfractionated: 0.42

## 2011-08-15 LAB — PREPARE FRESH FROZEN PLASMA

## 2011-08-15 LAB — POCT I-STAT GLUCOSE
Glucose, Bld: 116 — ABNORMAL HIGH
Glucose, Bld: 140 — ABNORMAL HIGH
Operator id: 3390
Operator id: 3390

## 2011-08-15 LAB — POCT CARDIAC MARKERS
CKMB, poc: 64.9
Myoglobin, poc: >500
Operator id: 198171
Troponin i, poc: 1.25

## 2011-08-15 LAB — D-DIMER, QUANTITATIVE: D-Dimer, Quant: <0.22

## 2011-08-15 LAB — PREPARE PLATELET PHERESIS

## 2011-08-15 LAB — ABO/RH: ABO/RH(D): A POS

## 2011-08-15 LAB — PLATELET COUNT: Platelets: 85 — ABNORMAL LOW

## 2011-09-19 ENCOUNTER — Other Ambulatory Visit (HOSPITAL_COMMUNITY): Payer: Self-pay | Admitting: Family Medicine

## 2011-09-19 DIAGNOSIS — Z1231 Encounter for screening mammogram for malignant neoplasm of breast: Secondary | ICD-10-CM

## 2011-10-02 ENCOUNTER — Ambulatory Visit (HOSPITAL_COMMUNITY)
Admission: RE | Admit: 2011-10-02 | Discharge: 2011-10-02 | Disposition: A | Discharge status: Home / Self Care | Payer: Medicare Other | Source: Ambulatory Visit | Attending: Family Medicine | Admitting: Family Medicine

## 2011-10-02 ENCOUNTER — Telehealth: Payer: Self-pay | Admitting: Oncology

## 2011-10-02 DIAGNOSIS — Z1231 Encounter for screening mammogram for malignant neoplasm of breast: Secondary | ICD-10-CM | POA: Insufficient documentation

## 2011-10-02 NOTE — Telephone Encounter (Signed)
Returned pt's call and gv her appt for 2/12.

## 2011-12-05 ENCOUNTER — Telehealth: Payer: Self-pay | Admitting: Oncology

## 2011-12-05 NOTE — Telephone Encounter (Signed)
S/w pt today re new time for 2/12 @ 4 pm. Time changed per robin.

## 2011-12-05 NOTE — Telephone Encounter (Signed)
Pt called back and moved 2/12 appt to 3/18 @ 11:30 am due to conflicting appt @ [facility].

## 2011-12-18 ENCOUNTER — Other Ambulatory Visit: Payer: Medicare Other | Admitting: Lab

## 2011-12-18 ENCOUNTER — Ambulatory Visit: Payer: Medicare Other | Admitting: Oncology

## 2012-01-21 ENCOUNTER — Other Ambulatory Visit (HOSPITAL_BASED_OUTPATIENT_CLINIC_OR_DEPARTMENT_OTHER): Payer: Medicare Other | Admitting: Lab

## 2012-01-21 ENCOUNTER — Encounter: Payer: Self-pay | Admitting: Oncology

## 2012-01-21 ENCOUNTER — Telehealth: Payer: Self-pay | Admitting: Oncology

## 2012-01-21 ENCOUNTER — Ambulatory Visit (HOSPITAL_BASED_OUTPATIENT_CLINIC_OR_DEPARTMENT_OTHER): Payer: Medicare Other | Admitting: Oncology

## 2012-01-21 DIAGNOSIS — M329 Systemic lupus erythematosus, unspecified: Secondary | ICD-10-CM

## 2012-01-21 DIAGNOSIS — Z7901 Long term (current) use of anticoagulants: Secondary | ICD-10-CM

## 2012-01-21 DIAGNOSIS — I82409 Acute embolism and thrombosis of unspecified deep veins of unspecified lower extremity: Secondary | ICD-10-CM

## 2012-01-21 DIAGNOSIS — Z86718 Personal history of other venous thrombosis and embolism: Secondary | ICD-10-CM

## 2012-01-21 DIAGNOSIS — I82401 Acute embolism and thrombosis of unspecified deep veins of right lower extremity: Secondary | ICD-10-CM | POA: Insufficient documentation

## 2012-01-21 NOTE — Telephone Encounter (Signed)
gve the pt her march 2014 appt calendar °

## 2012-01-21 NOTE — Progress Notes (Signed)
This office note has been dictated.  #[id number]

## 2012-01-21 NOTE — Progress Notes (Signed)
CC:   Kylie Deeds, MD [name], M.D.  PROBLEM LIST: 1. History of unprovoked right lower extremity DVT in October 2005.  The patient     was on Coumadin from October 2005 through October 2011. 2. Possible protein S deficiency. 3. History of SLE diagnosed in 2005. 4. Multivessel coronary artery disease, status post CABG in October     2008. 5. Hypertension. 6. Dyslipidemia.  MEDICATIONS: 1. Lotensin 10 mg daily. 2. Plaquenil 200 mg daily. 3. Metoprolol 50 mg daily. 4. Zocor 40 mg every evening.  HISTORY:  [name] is a 61 year old African American female who is here today with her husband, [name], for followup of an unprovoked DVT involving the right lower extremity dating back to October 2005.  The patient has been off of Coumadin since October 2011.  [name] was last seen by [place] on 06/18/2011.  On that visit, I had encouraged the patient to take aspirin 81 mg daily.  She tried this for a few days, but did not tolerate aspirin because of dyspeptic symptoms and nausea. Fortunately she continues to do well.  There has been no suggestion of blood clots in her legs or any change in clinical status since August 2012.  The patient has felt generally well with no medical problems since that time.  PHYSICAL EXAMINATION:  General:  She looks well and is in good spirits. Vital Signs:  Weight is 245.2 pounds, height 5 feet 2-1/2 inches, body surface area 2.21 sq m.  Blood pressure 145/64.  Other vital signs are normal.  HEENT:  There is no scleral icterus.  Mouth and pharynx are benign.  Lymph:  No peripheral adenopathy palpable.  Heart and Lungs : Normal.  Abdomen:  With the patient sitting is obese, nontender with no organomegaly or masses palpable.  Extremities:  Puffy legs, but no edema or tenderness.  Neurologic Exam:  Grossly normal.  LABORATORY DATA:  Laboratory data from today are pending.  Specifically, we are checking a D-dimer, free protein S, and functional  protein S. Lab data from 06/18/2011 notable for protein S activity of 48% with normal being 69% to 129%.  Free protein S activity was 58% with normal being 50% to 147%.  Back on 02/20/2011, protein S activity was 59%, which was still low.  On 11/20/2010, protein S activity was 43%, protein S antigen free was 60%, which is in the normal range, and protein S antigen total was 98%.  These values suggest to me that the patient may have an abnormal protein S with lower activity than would be indicated by the level of the protein measured by an antigen antibody reaction.  D- dimer on 06/18/2011 was 0.47 with normal being 0.00 to 0.48.  On 02/20/2011, chemistries were normal except for a glucose of 132.  CBC was normal.  IMAGING STUDIES: 1. Doppler study of the right leg on 09/20/2004 showed deep venous     thrombosis of the distal superficial femoral and popliteal veins.     The patient did have a followup Doppler study on 08/22/2010,     however, I cannot find the results in Epic.  It is my recollection     that that study was negative. 2. Chest x-ray, 2 view, from 09/30/2007 showed cardiomegaly. 3. Digital screening mammogram on 10/02/2011 was negative.  IMPRESSION AND PLAN:  [name] continues to do well with no signs of recurrent blood clots.  Unfortunately, she was unable to tolerate aspirin.  She is off of  Coumadin since October 2011 and has done well. Once again, we reviewed how to minimize her risk for blood clots by avoiding any type of inactivity, prolonged sitting or lying with regard to car and airplane travel.  The patient is doing well.  She has had low protein S activity in the past.  We are checking that again today.  I have asked [name] to return in 1 year, at which time we will again check protein S activity, antigen levels, and a D-dimer.  The patient knows to be on the alert for any type of swelling or pain in her legs or any unexplained trouble  breathing.    ______________________________ Samul Dada, M.D. DSM/MEDQ  D:  01/21/2012  T:  01/21/2012  Job:  [id number]

## 2012-01-22 LAB — PROTEIN S ACTIVITY: Protein S Activity: 64 % — ABNORMAL LOW (ref 69–129)

## 2012-01-22 LAB — PROTEIN S, ANTIGEN, FREE: Protein S Ag, Free: 59 % normal (ref 50–147)

## 2012-01-22 LAB — D-DIMER, QUANTITATIVE: D-Dimer, Quant: 0.44 ug/mL-FEU (ref 0.00–0.48)

## 2012-12-25 ENCOUNTER — Other Ambulatory Visit (HOSPITAL_COMMUNITY): Payer: Self-pay | Admitting: Family Medicine

## 2012-12-25 DIAGNOSIS — Z1231 Encounter for screening mammogram for malignant neoplasm of breast: Secondary | ICD-10-CM

## 2012-12-29 ENCOUNTER — Ambulatory Visit (HOSPITAL_COMMUNITY)
Admission: RE | Admit: 2012-12-29 | Discharge: 2012-12-29 | Disposition: A | Discharge status: Home / Self Care | Payer: Medicare Other | Source: Ambulatory Visit | Attending: Family Medicine | Admitting: Family Medicine

## 2012-12-29 DIAGNOSIS — Z1231 Encounter for screening mammogram for malignant neoplasm of breast: Secondary | ICD-10-CM

## 2013-01-22 ENCOUNTER — Other Ambulatory Visit: Payer: Medicare Other | Admitting: Lab

## 2013-01-22 ENCOUNTER — Ambulatory Visit (HOSPITAL_BASED_OUTPATIENT_CLINIC_OR_DEPARTMENT_OTHER): Payer: Medicare Other | Admitting: Physician Assistant

## 2013-01-22 ENCOUNTER — Ambulatory Visit: Payer: Medicare Other | Admitting: Oncology

## 2013-01-22 VITALS — BP 131/57 | HR 59 | Temp 97.2°F | Resp 22 | Ht 62.5 in | Wt 262.3 lb

## 2013-01-22 DIAGNOSIS — Z86718 Personal history of other venous thrombosis and embolism: Secondary | ICD-10-CM

## 2013-01-22 DIAGNOSIS — I82401 Acute embolism and thrombosis of unspecified deep veins of right lower extremity: Secondary | ICD-10-CM

## 2013-01-22 DIAGNOSIS — D6859 Other primary thrombophilia: Secondary | ICD-10-CM

## 2013-01-22 NOTE — Patient Instructions (Signed)
No further follow appointments are needed. No hormone replacement is recommended due to the increased risk for clotting.

## 2013-01-22 NOTE — Progress Notes (Signed)
[facility]  [phone]    OFFICE PROGRESS NOTE  CC:   Alben Deeds, MD Dario Guardian, M.D.  PROBLEM LIST: 1. History of unprovoked right lower extremity DVT in October 2005.  The patient     was on Coumadin from October 2005 through October 2011. 2. Possible protein S deficiency. 3. History of SLE diagnosed in 2005. 4. Multivessel coronary artery disease, status post CABG in October     2008. 5. Hypertension. 6. Dyslipidemia.  HISTORY:  [name] is a 62 year old African American female who is here today with her husband, [name], for followup of an unprovoked DVT involving the right lower extremity dating back to October 2005.  The patient has been off of Coumadin since October 2011.  Ms. Armon was last seen by [place] on 3/18/13Fortunately she continues to do well at this time. Of note, in September 2013 she had a Lupus flare consisting of malar rash and "blood shot eyes" resolving on its own in about 3 weeks without use of steroids or other medications.  SHe has gained about 17 lbs from 145.2 to 262.3 today, which she attributes to increased eating behavior and less mobility during the winter time. She is about to begin a diet soon. She  Is due for a visit with her cardiologist and PCP within the next month for follow up on her other medical issues. There has been no suggestion of blood clots in her legs or any change in clinical status since last visit.  The patient has felt generally well .her last mammogram performed on 12/29/2012 was normal.  MEDICATIONS:  Current Outpatient Prescriptions  Medication Sig Dispense Refill  . benazepril (LOTENSIN) 10 MG tablet Take 10 mg by mouth daily.      . hydroxychloroquine (PLAQUENIL) 200 MG tablet Take by mouth daily.      . metoprolol (LOPRESSOR) 50 MG tablet Take 50 mg by mouth daily.      . simvastatin (ZOCOR) 40 MG tablet Take 40 mg by mouth every evening.       No current facility-administered  medications for this visit.    ALLERGIES:   Allergies  Allergen Reactions  . Aspirin Nausea And Vomiting  . Meloxicam Hives     PHYSICAL EXAMINATION:   Filed Vitals:   01/22/13 0908  BP: 131/57  Pulse: 59  Temp: 97.2 F (36.2 C)  Resp: 22   Filed Weights   01/22/13 0908  Weight: 262 lb 4.8 oz (118.978 kg)    PHYSICAL EXAMINATION:  General:  She looks well and is in good spirits. HEENT:  There is no scleral icterus.  Mouth and pharynx are benign.  Lymph:  No peripheral adenopathy palpable.  Heart and Lungs :Normal.  Abdomen:  With the patient sitting is obese, nontender with no organomegaly or masses palpable.  Extremities:  Puffy legs, but no edema or tenderness.  Neurologic Exam:  Grossly normal.  LABORATORY/RADIOLOGY DATA:  Laboratory data from today are pending.  Specifically,we are checking a D-dimer, free protein S, and functional protein S. Lab data from 01/21/2012 shows the protein S activity of 64, and a free protein S activity of 59 %. Lab data from  06/18/2011 notable for protein S activity of 48% with normal being 69% to 129%.  Free protein S activity was 58% with normal being 50% to 147%.  Back on 02/20/2011, protein S activity was 59%,which was still low.  On 11/20/2010, protein S activity was 43%, protein S antigen free was  60%, which is in the normal range, and protein S antigen total was 98%.  These values suggest to me that the patient may have an abnormal protein S with lower activity than would be indicated by the level of the protein measured by an antigen antibody reaction.  D-dimer on 06/18/2011 was 0.47 with normal being 0.00 to 0.48.  On 02/20/2011, chemistries were normal except for a glucose of 132.  CBC was normal.    Radiology Studies:  1. Doppler study of the right leg on 09/20/2004 showed deep venous     thrombosis of the distal superficial femoral and popliteal veins.    The patient did have a followup Doppler study on 08/22/2010, that study was  negative. 2. Chest x-ray, 2 view, from 09/30/2007 showed cardiomegaly. 3. Digital screening mammogram on 10/02/2011 was negative. 4. Digital Screening Mammogram 12/30/2012 was  Negative.     ASSESSMENT AND PLAN:    Ms. Daniel continues to do well with no signs of recurrent blood clots.  Unfortunately, she was unable to tolerate aspirin.  She is off of Coumadin since October 2011 and has done well.Once again, we reviewed how to minimize her risk for blood clots by avoiding any type of inactivity, prolonged sitting or lying with regard to car and airplane travel.  The patient is doing well.  She has had low protein S activity in the past.  We are checking that again today The patient knows to be on the alert for any type of swelling or pain in her legs or any unexplained trouble breathing.Dr. Arline Asp has seen and evaluated the patient today,concluding that the patient would not require any further followup visits do to stable lab work. Dr.Murinson explained to the patient the need to avoid hormonal replacement which may increase the risk of clotting. Patient understood instructions. It has been a pleasure to care for Ms. Venita Lick E, PA-C 01/22/2013, 9:57 AM

## 2013-01-23 LAB — PROTEIN S, TOTAL: Protein S Total: 77 % (ref 60–150)

## 2013-06-23 ENCOUNTER — Other Ambulatory Visit: Payer: Self-pay | Admitting: Cardiovascular Disease

## 2013-06-30 ENCOUNTER — Other Ambulatory Visit: Payer: Self-pay | Admitting: *Deleted

## 2013-06-30 MED ORDER — SIMVASTATIN 40 MG PO TABS: 40.0000 mg | ORAL_TABLET | Freq: Every evening | ORAL | Status: DC

## 2013-08-14 ENCOUNTER — Encounter: Payer: Self-pay | Admitting: Cardiovascular Disease

## 2013-09-16 ENCOUNTER — Ambulatory Visit: Payer: Medicare Other | Admitting: Cardiovascular Disease

## 2013-09-17 ENCOUNTER — Ambulatory Visit (INDEPENDENT_AMBULATORY_CARE_PROVIDER_SITE_OTHER): Payer: Medicare Other | Admitting: Cardiovascular Disease

## 2013-09-17 ENCOUNTER — Encounter: Payer: Self-pay | Admitting: Cardiovascular Disease

## 2013-09-17 VITALS — BP 148/80 | HR 72 | Ht 61.5 in | Wt 256.1 lb

## 2013-09-17 DIAGNOSIS — I2510 Atherosclerotic heart disease of native coronary artery without angina pectoris: Secondary | ICD-10-CM

## 2013-09-17 DIAGNOSIS — I119 Hypertensive heart disease without heart failure: Secondary | ICD-10-CM

## 2013-09-17 DIAGNOSIS — E782 Mixed hyperlipidemia: Secondary | ICD-10-CM

## 2013-09-17 DIAGNOSIS — I440 Atrioventricular block, first degree: Secondary | ICD-10-CM

## 2013-09-17 DIAGNOSIS — I10 Essential (primary) hypertension: Secondary | ICD-10-CM

## 2013-09-17 DIAGNOSIS — G4733 Obstructive sleep apnea (adult) (pediatric): Secondary | ICD-10-CM

## 2013-09-17 DIAGNOSIS — I4510 Unspecified right bundle-branch block: Secondary | ICD-10-CM

## 2013-09-17 DIAGNOSIS — E785 Hyperlipidemia, unspecified: Secondary | ICD-10-CM

## 2013-09-17 LAB — TSH: TSH: 3.208 u[IU]/mL (ref 0.350–4.500)

## 2013-09-17 LAB — CBC
HCT: 43.2 % (ref 36.0–46.0)
Hemoglobin: 14.4 g/dL (ref 12.0–15.0)
MCH: 29.7 pg (ref 26.0–34.0)
MCHC: 33.3 g/dL (ref 30.0–36.0)
MCV: 89.1 fL (ref 78.0–100.0)
Platelets: 205 10*3/uL (ref 150–400)
RBC: 4.85 MIL/uL (ref 3.87–5.11)
RDW: 14.5 % (ref 11.5–15.5)
WBC: 5.2 10*3/uL (ref 4.0–10.5)

## 2013-09-17 LAB — COMPREHENSIVE METABOLIC PANEL
ALT: 19 U/L (ref 0–35)
AST: 32 U/L (ref 0–37)
Albumin: 4.4 g/dL (ref 3.5–5.2)
Alkaline Phosphatase: 76 U/L (ref 39–117)
BUN: 14 mg/dL (ref 6–23)
CO2: 26 mEq/L (ref 19–32)
Calcium: 9.5 mg/dL (ref 8.4–10.5)
Chloride: 104 mEq/L (ref 96–112)
Creat: 0.9 mg/dL (ref 0.50–1.10)
Glucose, Bld: 101 mg/dL — ABNORMAL HIGH (ref 70–99)
Potassium: 4 mEq/L (ref 3.5–5.3)
Sodium: 138 mEq/L (ref 135–145)
Total Bilirubin: 0.4 mg/dL (ref 0.3–1.2)
Total Protein: 8.3 g/dL (ref 6.0–8.3)

## 2013-09-17 LAB — LIPID PANEL
Cholesterol: 164 mg/dL (ref 0–200)
HDL: 64 mg/dL (ref >39)
LDL Cholesterol: 83 mg/dL (ref 0–99)
Total CHOL/HDL Ratio: 2.6 Ratio
Triglycerides: 87 mg/dL (ref <150)
VLDL: 17 mg/dL (ref 0–40)

## 2013-09-17 NOTE — Patient Instructions (Addendum)
Your physician recommends that you return for lab work fasting. Do not eat or drink after midnight the night prior to going to the lab. You will not need a appointment. The lab opens usually at 8:00 a.m.  Your physician recommends that you schedule a follow-up appointment in: 1 YEAR.

## 2013-10-08 ENCOUNTER — Encounter: Payer: Self-pay | Admitting: Cardiovascular Disease

## 2013-10-08 DIAGNOSIS — I2510 Atherosclerotic heart disease of native coronary artery without angina pectoris: Secondary | ICD-10-CM | POA: Insufficient documentation

## 2013-10-08 DIAGNOSIS — I10 Essential (primary) hypertension: Secondary | ICD-10-CM | POA: Insufficient documentation

## 2013-10-08 DIAGNOSIS — G4733 Obstructive sleep apnea (adult) (pediatric): Secondary | ICD-10-CM | POA: Insufficient documentation

## 2013-10-08 DIAGNOSIS — E785 Hyperlipidemia, unspecified: Secondary | ICD-10-CM | POA: Insufficient documentation

## 2013-10-08 DIAGNOSIS — I4510 Unspecified right bundle-branch block: Secondary | ICD-10-CM | POA: Insufficient documentation

## 2013-10-08 DIAGNOSIS — I440 Atrioventricular block, first degree: Secondary | ICD-10-CM | POA: Insufficient documentation

## 2013-10-08 NOTE — Progress Notes (Signed)
Patient ID: [name], female   DOB: July 20, 1951, 62 y.o.   MRN: [id number]     HPI: [name] is a 62 y.o. female who presents to the office for a seven-month cardiology evaluation.  [name] has a history of coronary artery disease in October 2008 underwent CABG surgery by Dr. Virgel Gess the LIMA to the LAD, vein to the obtuse marginal, vein to the RCA. Her last nuclear perfusion study in October 2013 was unchanged and showed mild inferior abnormality.  She has a history of moderate obstructive sleep apnea which is severe during REM sleep. She was initially started on CPAP therapy 2010 but when I last saw her in April 2014 she had not been using therapy for over the past year. At that time, I strongly advise reinstitution of CPAP therapy. She has now reestablished with a new [employer]. She has been using CPAP with 100% compliance and feels significantly improved. She denies residual daytime sleepiness. She denies rate to snoring.  Additional problems include hyperlipidemia, right bundle branch block, systemic lupus erythematosus, as well as obesity.  She presently denies any chest pain. He denies shortness of breath. She is unaware of palpitations.  Past Medical History  Diagnosis Date  . Hypertension   . Hyperlipidemia   . Sleep apnea     Past Surgical History  Procedure Laterality Date  . Cardiac catheterization    . Coronary angioplasty    . Coronary artery bypass graft  2008    Allergies  Allergen Reactions  . Aspirin Nausea And Vomiting  . Meloxicam Hives    Current Outpatient Prescriptions  Medication Sig Dispense Refill  . benazepril (LOTENSIN) 10 MG tablet Take 10 mg by mouth daily.      . hydroxychloroquine (PLAQUENIL) 200 MG tablet Take by mouth daily.      . metoprolol (LOPRESSOR) 50 MG tablet TAKE 1 AND 1/2 TABLETS BY MOUTH TWICE A DAY  90 tablet  5  . NON FORMULARY CPAP therapy      . OVER THE COUNTER MEDICATION Take 1 capsule by mouth daily.  Complete omega      . OVER THE COUNTER MEDICATION Take 1 tablet by mouth daily. Active Adult 50 plus      . simvastatin (ZOCOR) 40 MG tablet Take 1 tablet (40 mg total) by mouth every evening.  30 tablet  6   No current facility-administered medications for this visit.    History   Social History  . Marital Status: Married    Spouse Name: N/A    Number of Children: N/A  . Years of Education: N/A   Occupational History  . Not on file.   Social History Main Topics  . Smoking status: Never Smoker   . Smokeless tobacco: Never Used  . Alcohol Use: No  . Drug Use: Not on file  . Sexual Activity: Not on file   Other Topics Concern  . Not on file   Social History Narrative  . No narrative on file   Social history is notable that she is married has one child one deceased. She does not routinely exercise. He denies alcohol use or tobacco.   Family History  Problem Relation Age of Onset  . Heart disease Mother   . Heart attack Mother   . Hypertension Mother   . Hyperlipidemia Mother   . Cancer - Other Mother     breast  . Heart attack Father   . Heart disease Father   .  Hyperlipidemia Father   . Hypertension Father   . Cancer - Other Sister     thyroid  . Diabetes Brother   . Cancer - Other Sister     breast  . Cancer - Other Sister     breast;  had mastectomy  . Hypertension Sister     ROS is negative for fevers, chills or night sweats.  She denies skin rash. She denies any significant weight loss but actually has gained 3 pounds. She denies visual changes. She denies hearing changes. She is unaware of lymphadenopathy. She admits to using her CPAP with compliance. She denies restless legs. She denies bruxism. She denies hypnagogic hallucinations. She denies chest pressure. She denies cough or increased sputum production. She denies wheezing. She denies nausea, vomiting, diarrhea. She denies blood in her stool or urine. There is no claudication. She denies myalgias. She  denies tremors. She denies seizures. There is no diabetes. She denies any awareness of thyroid problems.  Other comprehensive 12 point system review is negative.  PE BP 148/80  Pulse 72  Ht 5' 1.5" (1.562 m)  Wt 116.166 kg (256 lb 1.6 oz)  BMI 47.61 kg/m2  Morbidly obese General: Alert, oriented, no distress.  Skin: normal turgor, no rashes HEENT: Normocephalic, atraumatic. Pupils round and reactive; sclera anicteric;no lid lag.  Nose without nasal septal hypertrophy Mouth/Parynx benign; Mallinpatti scale 3/4 Neck: No JVD, no carotid briuts Lungs: clear to ausculatation and percussion; no wheezing or rales Heart: RRR, s1 s2 normal 1/6 systolic murmur Abdomen: soft, nontender; no hepatosplenomehaly, BS+; abdominal aorta nontender and not dilated by palpation. Pulses 2+ Extremities: no clubbing cyanosis or edema, Homan's sign negative  Neurologic: grossly nonfocal Psychologic: normal affect and mood.  ECG: Sinus rhythm with right bundle branch block. First degree AV block with a PR interval of 208 ms   LABS:  BMET    Component Value Date/Time   NA 138 09/17/2013 0956   K 4.0 09/17/2013 0956   CL 104 09/17/2013 0956   CO2 26 09/17/2013 0956   GLUCOSE 101* 09/17/2013 0956   BUN 14 09/17/2013 0956   CREATININE 0.90 09/17/2013 0956   CREATININE 1.03 02/20/2011 1037   CALCIUM 9.5 09/17/2013 0956   GFRNONAA >60 09/08/2007 0253   GFRAA  Value: >60        The eGFR has been calculated using the MDRD equation. This calculation has not been validated in all clinical 09/08/2007 0253     Hepatic Function Panel     Component Value Date/Time   PROT 8.3 09/17/2013 0956   ALBUMIN 4.4 09/17/2013 0956   AST 32 09/17/2013 0956   ALT 19 09/17/2013 0956   ALKPHOS 76 09/17/2013 0956   BILITOT 0.4 09/17/2013 0956     CBC    Component Value Date/Time   WBC 5.2 09/17/2013 0956   WBC 4.2 02/20/2011 1037   RBC 4.85 09/17/2013 0956   RBC 5.07 02/20/2011 1037   HGB 14.4 09/17/2013 0956    HGB 14.8 02/20/2011 1037   HCT 43.2 09/17/2013 0956   HCT 45.3 02/20/2011 1037   PLT 205 09/17/2013 0956   PLT 178 02/20/2011 1037   MCV 89.1 09/17/2013 0956   MCV 89.3 02/20/2011 1037   MCH 29.7 09/17/2013 0956   MCH 29.2 02/20/2011 1037   MCHC 33.3 09/17/2013 0956   MCHC 32.7 02/20/2011 1037   RDW 14.5 09/17/2013 0956   RDW 13.9 02/20/2011 1037   LYMPHSABS 1.6 02/20/2011 1037   LYMPHSABS 1.6 09/01/2007  1648   MONOABS 0.4 02/20/2011 1037   MONOABS 0.6 09/01/2007 1648   EOSABS 0.1 02/20/2011 1037   EOSABS 0.0 09/01/2007 1648   BASOSABS 0.0 02/20/2011 1037   BASOSABS 0.0 09/01/2007 1648     BNP    Component Value Date/Time   PROBNP 858.0* 09/09/2007 0340    Lipid Panel     Component Value Date/Time   CHOL 164 09/17/2013 0956   TRIG 87 09/17/2013 0956   HDL 64 09/17/2013 0956   CHOLHDL 2.6 09/17/2013 0956   VLDL 17 09/17/2013 0956   LDLCALC 83 09/17/2013 0956     RADIOLOGY: No results found.    ASSESSMENT AND PLAN:  From a cardiac standpoint, Ms. Blizzard is doing well now 6 years following her bypass surgery. She did undergo a nuclear perfusion study one year ago which remained unchanged over previous study and showed mild inferior abnormality. She now is back on CPAP and feels significantly improved. He is sleeping well. She denies residual daytime sleepiness. Her blood pressure is only elevated. She is taking Lopressor 75 mg twice a day, but hospital 10 mg. She is tolerating simvastatin 40 mg for hyperlipidemia with LDL 83. If her blood pressure remains increased and may be beneficial to increase her benazapril but I will not do this presently pending laboratory results. We did discuss the importance of weight loss particularly with a body mass index of 47.6 the she will followup with her primary physician Dr. Merri Brunette. I will see her one year for cardiology evaluation or sooner if problems arise.    Lennette Bihari, MD, [facility]  10/08/2013 3:34 PM

## 2013-10-22 ENCOUNTER — Telehealth: Payer: Self-pay | Admitting: *Deleted

## 2013-10-22 NOTE — Telephone Encounter (Signed)
Lab results were within normal limits. Dr. Tresa Endo pleased with results. Call if questions or concerns.

## 2013-10-22 NOTE — Telephone Encounter (Signed)
Message copied by Gaynelle Cage on Thu Oct 22, 2013  5:03 PM ------      Message from: Nicki Guadalajara A      Created: Thu Oct 22, 2013 10:58 AM       wnl ------

## 2014-02-03 ENCOUNTER — Other Ambulatory Visit: Payer: Self-pay

## 2014-02-03 MED ORDER — METOPROLOL TARTRATE 50 MG PO TABS: 75.0000 mg | ORAL_TABLET | Freq: Two times a day (BID) | ORAL | Status: DC

## 2014-02-03 NOTE — Telephone Encounter (Signed)
Rx was sent to pharmacy electronically. 

## 2014-02-15 ENCOUNTER — Other Ambulatory Visit: Payer: Self-pay

## 2014-02-15 MED ORDER — BENAZEPRIL HCL 10 MG PO TABS: 10.0000 mg | ORAL_TABLET | Freq: Every day | ORAL | Status: DC

## 2014-02-15 NOTE — Telephone Encounter (Signed)
Rx was sent to pharmacy electronically. 

## 2014-03-10 ENCOUNTER — Other Ambulatory Visit: Payer: Self-pay | Admitting: *Deleted

## 2014-03-10 MED ORDER — SIMVASTATIN 40 MG PO TABS
40.0000 mg | ORAL_TABLET | Freq: Every evening | ORAL | Status: DC
Start: 2014-03-10 — End: 2014-11-08

## 2014-03-10 NOTE — Telephone Encounter (Signed)
Rx refill sent to patient pharmacy   

## 2014-11-08 ENCOUNTER — Other Ambulatory Visit: Payer: Self-pay | Admitting: Cardiovascular Disease

## 2014-11-08 NOTE — Telephone Encounter (Signed)
Rx(s) sent to pharmacy electronically.  

## 2014-11-29 ENCOUNTER — Other Ambulatory Visit: Payer: Self-pay | Admitting: Cardiovascular Disease

## 2014-11-30 NOTE — Telephone Encounter (Signed)
Rx(s) sent to pharmacy electronically. Staff message sent to Billie to contact patient for appointment  

## 2014-12-03 ENCOUNTER — Telehealth: Payer: Self-pay | Admitting: Cardiovascular Disease

## 2014-12-06 ENCOUNTER — Other Ambulatory Visit: Payer: Self-pay | Admitting: Cardiovascular Disease

## 2014-12-06 NOTE — Telephone Encounter (Signed)
Rx(s) sent to pharmacy electronically. OV 02/04/15 

## 2014-12-15 NOTE — Telephone Encounter (Signed)
Closed encounter °

## 2015-01-07 ENCOUNTER — Telehealth: Payer: Self-pay | Admitting: Cardiovascular Disease

## 2015-01-10 NOTE — Telephone Encounter (Signed)
Close encounter 

## 2015-01-23 ENCOUNTER — Other Ambulatory Visit: Payer: Self-pay | Admitting: Cardiovascular Disease

## 2015-01-24 NOTE — Telephone Encounter (Signed)
Rx(s) sent to pharmacy electronically. OV 02/04/15 

## 2015-01-27 ENCOUNTER — Encounter: Payer: Self-pay | Admitting: *Deleted

## 2015-02-04 ENCOUNTER — Ambulatory Visit: Payer: Self-pay | Admitting: Cardiovascular Disease

## 2015-02-04 ENCOUNTER — Ambulatory Visit (INDEPENDENT_AMBULATORY_CARE_PROVIDER_SITE_OTHER): Payer: PPO | Admitting: Cardiovascular Disease

## 2015-02-04 VITALS — BP 138/74 | HR 71 | Ht 61.0 in | Wt 265.4 lb

## 2015-02-04 DIAGNOSIS — I25810 Atherosclerosis of coronary artery bypass graft(s) without angina pectoris: Secondary | ICD-10-CM

## 2015-02-04 DIAGNOSIS — I4510 Unspecified right bundle-branch block: Secondary | ICD-10-CM

## 2015-02-04 DIAGNOSIS — M25473 Effusion, unspecified ankle: Secondary | ICD-10-CM

## 2015-02-04 DIAGNOSIS — E785 Hyperlipidemia, unspecified: Secondary | ICD-10-CM

## 2015-02-04 DIAGNOSIS — Z9989 Dependence on other enabling machines and devices: Secondary | ICD-10-CM

## 2015-02-04 DIAGNOSIS — G4733 Obstructive sleep apnea (adult) (pediatric): Secondary | ICD-10-CM | POA: Diagnosis not present

## 2015-02-04 DIAGNOSIS — R609 Edema, unspecified: Secondary | ICD-10-CM

## 2015-02-04 LAB — LIPID PANEL
Cholesterol: 150 mg/dL (ref 0–200)
HDL: 62 mg/dL (ref >=46)
LDL Cholesterol: 68 mg/dL (ref 0–99)
Total CHOL/HDL Ratio: 2.4 Ratio
Triglycerides: 100 mg/dL (ref <150)
VLDL: 20 mg/dL (ref 0–40)

## 2015-02-04 NOTE — Patient Instructions (Signed)
Your physician recommends that you return for lab work fasting.  Your physician wants you to follow-up in: 1 year or sooner if needed with Dr. Kelly. You will receive a reminder letter in the mail two months in advance. If you don't receive a letter, please call our office to schedule the follow-up appointment. 

## 2015-02-05 ENCOUNTER — Encounter: Payer: Self-pay | Admitting: Cardiovascular Disease

## 2015-02-05 DIAGNOSIS — M25473 Effusion, unspecified ankle: Secondary | ICD-10-CM | POA: Insufficient documentation

## 2015-02-05 LAB — TSH: TSH: 3.031 u[IU]/mL (ref 0.350–4.500)

## 2015-02-05 NOTE — Progress Notes (Signed)
Patient ID: [name], female   DOB: 03/20/1951, 64 y.o.   MRN: [id number]      Primary M.D.: Dr. Merri Brunetteandace Smith   HPI: [name] is a 64 y.o. female who presents to the office for a 16 month follow-up cardiology evaluation.  [name] has a history of coronary artery disease in October 2008 underwent CABG surgery by Dr. Laneta SimmersBartle with a LIMA to the LAD, vein to the obtuse marginal, vein to the RCA. Her last nuclear perfusion study in October 2013 was unchanged and showed mild inferior abnormality.  She has a history of moderate obstructive sleep apnea which is severe during REM sleep. She was initially started on CPAP therapy 2010 but when I last saw her in April 2014 she had not been using therapy for over the past year. At that time, I strongly advise reinstitution of CPAP therapy. She reestablished with a new [employer] and has been using CPAP with 100% compliance and feels significantly improved. She denies residual daytime sleepiness. She denies rate to snoring.  Additional problems include hyperlipidemia, right bundle branch block, systemic lupus erythematosus, as well as obesity.  She sees Dr. Dierdre ForthBeekman for her lupus.  She presently denies any chest pain. He denies shortness of breath. She is unaware of palpitations.  She has noticed some mild occasional ankle swelling.  I did review recent laboratory done on 01/25/2015 by Dr. Dierdre ForthBeekman.  Her hemoglobin was 14.4, mag at 45.4 and platelets 181.  Renal function was normal with a BUN of 8, creatinine 0.9.  LFTs were normal.  Urinalysis was negative.  Past Medical History  Diagnosis Date  . Hypertension   . Hyperlipidemia   . Sleep apnea   . CAD (coronary artery disease)   . OSA (obstructive sleep apnea)   . RBBB   . Systemic lupus erythematosus   . Obesity     Past Surgical History  Procedure Laterality Date  . Cardiac catheterization  09/02/2007  . Coronary angioplasty    . Coronary artery bypass graft  09/02/2007   LIMA to the LAD,SVG to obtuse marginal branch of the left CX,SVG to the RCA    Allergies  Allergen Reactions  . Aspirin Nausea And Vomiting  . Meloxicam Hives    Current Outpatient Prescriptions  Medication Sig Dispense Refill  . benazepril (LOTENSIN) 5 MG tablet Take 5 mg by mouth daily.    . hydroxychloroquine (PLAQUENIL) 200 MG tablet Take by mouth daily.    . metoprolol (LOPRESSOR) 50 MG tablet Take 1.5 tablets (75 mg total) by mouth 2 (two) times daily. 90 tablet 0  . NON FORMULARY CPAP therapy    . OVER THE COUNTER MEDICATION Take 1 capsule by mouth daily. Complete omega    . OVER THE COUNTER MEDICATION Take 1 tablet by mouth daily. Active Adult 50 plus    . simvastatin (ZOCOR) 40 MG tablet Take 1 tablet (40 mg total) by mouth daily. 30 tablet 1   No current facility-administered medications for this visit.    History   Social History  . Marital Status: Married    Spouse Name: N/A  . Number of Children: N/A  . Years of Education: N/A   Occupational History  . Not on file.   Social History Main Topics  . Smoking status: Never Smoker   . Smokeless tobacco: Never Used  . Alcohol Use: No  . Drug Use: No  . Sexual Activity: Not on file   Other Topics Concern  .  Not on file   Social History Narrative   Social history is notable that she is married has one child one deceased. She does not routinely exercise. He denies alcohol use or tobacco.   Family History  Problem Relation Age of Onset  . Heart disease Mother   . Heart attack Mother   . Hypertension Mother   . Hyperlipidemia Mother   . Cancer - Other Mother     breast  . Heart attack Father   . Heart disease Father   . Hyperlipidemia Father   . Hypertension Father   . Cancer - Other Sister     thyroid  . Diabetes Brother   . Cancer - Other Sister     breast  . Cancer - Other Sister     breast;  had mastectomy  . Hypertension Sister    ROS General: Negative; No fevers, chills, or night sweats;    HEENT: Negative; No changes in vision or hearing, sinus congestion, difficulty swallowing Pulmonary: Negative; No cough, wheezing, shortness of breath, hemoptysis Cardiovascular: Negative; No chest pain, presyncope, syncope, palpitations GI: Negative; No nausea, vomiting, diarrhea, or abdominal pain GU: Negative; No dysuria, hematuria, or difficulty voiding Musculoskeletal: Negative; no myalgias, joint pain, or weakness Rheumatologic c: Positive for systemic lupus erythematosus Hematologic/Oncology: Negative; no easy bruising, bleeding Endocrine: Negative; no heat/cold intolerance; no diabetes Neuro: Negative; no changes in balance, headaches Skin: Negative; No rashes or skin lesions Psychiatric: Negative; No behavioral problems, depression Sleep: [place] for obstructive sleep apnea, now on CPAP with 100% compliance.  No snoring, daytime sleepiness, hypersomnolence, bruxism, restless legs, hypnogognic hallucinations, no cataplexy Other comprehensive 14 point system review is negative.   PE BP 138/74 mmHg  Pulse 71  Ht  (1.549 m)  Wt 265 lb 6.4 oz (120.385 kg)  BMI 50.17 kg/m2  Body mass index is compatible with super morbid obesity General: Alert, oriented, no distress.  Skin: normal turgor, no rashes HEENT: Normocephalic, atraumatic. Pupils round and reactive; sclera anicteric;no lid lag.  Nose without nasal septal hypertrophy Mouth/Parynx benign; upper dentures; Mallinpatti scale 3/4 Neck: No JVD, no carotid bruits with normal carotid upstroke Chest wall: Nontender to palpation Lungs: clear to ausculatation and percussion; no wheezing or rales Heart: RRR, s1 s2 normal 1/6 systolic murmur; no diastolic murmur.  No rubs thrills or heaves. Abdomen: soft, nontender; no hepatosplenomehaly, BS+; abdominal aorta nontender and not dilated by palpation. Back: No CVA tenderness Pulses 2+ Extremities: Mild ankle edema; no clubbing cyanosis, Homan's sign negative  Neurologic: grossly  nonfocal Psychologic: normal affect and mood.  ECG (independently read by me, and (: Normal sinus rhythm at 71 beats per minute.  Right bundle branch block with repolarization changes.  Q wave in lead 3.  November 2014 ECG: Sinus rhythm with right bundle branch block. First degree AV block with a PR interval of 208 ms   LABS:  BMET  BMP Latest Ref Rng 09/17/2013 02/20/2011 10/19/2010  Glucose 70 - 99 mg/dL 782(N) 562(Z) 308(M)  BUN 6 - 23 mg/dL Creatinine 0.50 - 1.10 mg/dL 5.78 4.69 6.29  Sodium 135 - 145 mEq/L 138 141 136  Potassium 3.5 - 5.3 mEq/L 4.0 3.9 4.0  Chloride 96 - 112 mEq/L 104 104 102  CO2 19 - 32 mEq/L Calcium 8.4 - 10.5 mg/dL 9.5 9.6 9.4     Hepatic Function Panel     Component Value Date/Time   PROT 8.3 09/17/2013 0956   ALBUMIN  4.4 09/17/2013 0956   AST 32 09/17/2013 0956   ALT 19 09/17/2013 0956   ALKPHOS 76 09/17/2013 0956   BILITOT 0.4 09/17/2013 0956     CBC  CBC Latest Ref Rng 09/17/2013 02/20/2011 10/19/2010  WBC 4.0 - 10.5 K/uL 5.2 4.2 4.7  Hemoglobin 12.0 - 15.0 g/dL 16.1 09.6 04.5  Hematocrit 36.0 - 46.0 % 43.2 45.3 43.9  Platelets 150 - 400 K/uL 205 178 193     BNP    Component Value Date/Time   PROBNP 858.0* 09/09/2007 0340    Lipid Panel     Component Value Date/Time   CHOL 150 02/04/2015 0957   TRIG 100 02/04/2015 0957   HDL 62 02/04/2015 0957   CHOLHDL 2.4 02/04/2015 0957   VLDL 20 02/04/2015 0957   LDLCALC 68 02/04/2015 0957     RADIOLOGY: No results found.    ASSESSMENT AND PLAN: [name] is a 64 year old female who is 7 a half years status post CABG revascularization surgery.  Her last nuclear perfusion studyremained unchanged over previous study and showed mild inferior abnormality.  Her blood pressure today is stable on metoprolol 75 mg twice a day, but has a total 5 mg daily.  She has obstructive sleep apnea and continues to feel well and now admits to 100% compliance with reference to CPAP  therapy.  I reviewed her recent blood work done by Dr. Dierdre Forth last week.  Her renal function is stable.  It does not appear that lipid studies were done and I have recommended that this be checked today in addition to TSH level.  Target LDL is less than 70.  She has tolerated simvastatin 40 mg.  We discussed her weight.  She now is in the super morbid obesity range with a body mass index of 50.17.  I have strongly encouraged weight loss.  We also discussed some questions she had regarding potential bariatric surgery and I provided her with several names of physicians in [facility] [place].  I am giving her a prescription for HCTZ 12.5 mg to take on an as-needed basis for lower extremity edema and I have suggested that she wear support stockings.  Neurologically, she is doing well.  I will see her in one year for follow-up evaluation.  Lennette Bihari, MD, [facility]  02/05/2015 3:54 PM

## 2015-02-07 ENCOUNTER — Encounter: Payer: Self-pay | Admitting: *Deleted

## 2015-02-11 ENCOUNTER — Other Ambulatory Visit: Payer: Self-pay | Admitting: Cardiovascular Disease

## 2015-02-16 ENCOUNTER — Other Ambulatory Visit: Payer: Self-pay | Admitting: Cardiovascular Disease

## 2015-03-07 ENCOUNTER — Other Ambulatory Visit: Payer: Self-pay | Admitting: Cardiovascular Disease

## 2015-04-10 ENCOUNTER — Other Ambulatory Visit: Payer: Self-pay | Admitting: Cardiovascular Disease

## 2015-04-11 NOTE — Telephone Encounter (Signed)
Rx has been sent to the pharmacy electronically. ° °

## 2015-06-21 ENCOUNTER — Telehealth: Payer: Self-pay | Admitting: Cardiovascular Disease

## 2015-06-21 NOTE — Telephone Encounter (Signed)
Pt need new prescription for her C-Pap equipment,her insurance have changed.

## 2015-06-21 NOTE — Telephone Encounter (Signed)
addendum Patient states [facility] informed her that her insurance "healthteam advantage" is covered by [facility]. RN informed patient will defer to [facility] ,to follow up with sending order.[name] Kitchen

## 2015-06-21 NOTE — Telephone Encounter (Signed)
Spoke to patient  She states she called and spoke to [facility] [facility]  today. Patient states she has changed insurance

## 2015-07-25 ENCOUNTER — Telehealth: Payer: Self-pay | Admitting: Cardiovascular Disease

## 2015-07-25 NOTE — Telephone Encounter (Signed)
Telephone note regarding this from August 2016 in [facility]

## 2015-07-25 NOTE — Telephone Encounter (Signed)
Pt is calling in stating that she needs some supplies for his CPAP machine( new tube and mask). She needs Dr. Tresa Endo to write her a new prescription for these things because she has switched insurances. She says that her husband has his supplies order from [place] and she would like to get her things from there as well. Please f/u with the pt  Thanks

## 2015-07-28 NOTE — Telephone Encounter (Signed)
Victorino Dike @ [facility] informed me that she has taken care of this.

## 2015-07-28 NOTE — Telephone Encounter (Signed)
Has this been taken care of?

## 2015-08-03 ENCOUNTER — Other Ambulatory Visit: Payer: Self-pay | Admitting: Cardiovascular Disease

## 2015-08-03 NOTE — Telephone Encounter (Signed)
REFILL 

## 2015-08-29 ENCOUNTER — Telehealth: Payer: Self-pay | Admitting: Cardiovascular Disease

## 2015-08-29 NOTE — Telephone Encounter (Signed)
Returned a call to patient informing her that I was told by Victorino DikeJennifer @ [facility] on 07/28/15 that this has been taken care of. Questioned wether she has called [facility]. She states that she has made contact with them. They have no records. I called and spoke with jennifer and she states that she will check  Again tomorrow to be sure that she sent the transfer to [facility]. If not she will resend the information. Patient voiced understanding.

## 2015-08-29 NOTE — Telephone Encounter (Signed)
Pt says she wanted you to change her to [facility]. Still have not heard from anyone. Please call asap,need a new mask and hose.

## 2015-08-30 ENCOUNTER — Telehealth: Payer: Self-pay | Admitting: *Deleted

## 2015-08-30 NOTE — Telephone Encounter (Signed)
Faxed CPAP order to [facility] to manage patient's CPAP ans supplies. Patient notified.

## 2015-10-27 ENCOUNTER — Telehealth: Payer: Self-pay | Admitting: *Deleted

## 2015-10-27 NOTE — Telephone Encounter (Signed)
Faxed CPAP supply order to [facility]. 

## 2016-02-08 DIAGNOSIS — R899 Unspecified abnormal finding in specimens from other organs, systems and tissues: Secondary | ICD-10-CM | POA: Diagnosis not present

## 2016-02-08 DIAGNOSIS — M62838 Other muscle spasm: Secondary | ICD-10-CM | POA: Diagnosis not present

## 2016-02-08 DIAGNOSIS — M329 Systemic lupus erythematosus, unspecified: Secondary | ICD-10-CM | POA: Diagnosis not present

## 2016-02-08 DIAGNOSIS — M170 Bilateral primary osteoarthritis of knee: Secondary | ICD-10-CM | POA: Diagnosis not present

## 2016-02-08 DIAGNOSIS — M79644 Pain in right finger(s): Secondary | ICD-10-CM | POA: Diagnosis not present

## 2016-02-22 DIAGNOSIS — H2513 Age-related nuclear cataract, bilateral: Secondary | ICD-10-CM | POA: Diagnosis not present

## 2016-02-22 DIAGNOSIS — Z79899 Other long term (current) drug therapy: Secondary | ICD-10-CM | POA: Diagnosis not present

## 2016-02-22 DIAGNOSIS — H43393 Other vitreous opacities, bilateral: Secondary | ICD-10-CM | POA: Diagnosis not present

## 2016-02-22 DIAGNOSIS — H40013 Open angle with borderline findings, low risk, bilateral: Secondary | ICD-10-CM | POA: Diagnosis not present

## 2016-03-01 ENCOUNTER — Other Ambulatory Visit: Payer: Self-pay | Admitting: Cardiovascular Disease

## 2016-03-22 DIAGNOSIS — G4733 Obstructive sleep apnea (adult) (pediatric): Secondary | ICD-10-CM | POA: Diagnosis not present

## 2016-03-23 ENCOUNTER — Other Ambulatory Visit: Payer: Self-pay | Admitting: Cardiovascular Disease

## 2016-03-23 NOTE — Telephone Encounter (Signed)
Rx(s) sent to pharmacy electronically.  

## 2016-04-09 ENCOUNTER — Other Ambulatory Visit: Payer: Self-pay | Admitting: Physician Assistant

## 2016-04-09 ENCOUNTER — Ambulatory Visit
Admission: RE | Admit: 2016-04-09 | Discharge: 2016-04-09 | Disposition: A | Discharge status: Home / Self Care | Payer: PPO | Source: Ambulatory Visit | Attending: Physician Assistant | Admitting: Physician Assistant

## 2016-04-09 DIAGNOSIS — M329 Systemic lupus erythematosus, unspecified: Secondary | ICD-10-CM | POA: Diagnosis not present

## 2016-04-09 DIAGNOSIS — M79604 Pain in right leg: Secondary | ICD-10-CM | POA: Diagnosis not present

## 2016-04-09 DIAGNOSIS — R600 Localized edema: Secondary | ICD-10-CM | POA: Diagnosis not present

## 2016-04-09 DIAGNOSIS — I824Z1 Acute embolism and thrombosis of unspecified deep veins of right distal lower extremity: Secondary | ICD-10-CM

## 2016-04-09 DIAGNOSIS — M170 Bilateral primary osteoarthritis of knee: Secondary | ICD-10-CM | POA: Diagnosis not present

## 2016-04-09 DIAGNOSIS — R899 Unspecified abnormal finding in specimens from other organs, systems and tissues: Secondary | ICD-10-CM | POA: Diagnosis not present

## 2016-04-11 DIAGNOSIS — R609 Edema, unspecified: Secondary | ICD-10-CM | POA: Diagnosis not present

## 2016-04-11 DIAGNOSIS — Z0000 Encounter for general adult medical examination without abnormal findings: Secondary | ICD-10-CM | POA: Diagnosis not present

## 2016-04-11 DIAGNOSIS — E7800 Pure hypercholesterolemia, unspecified: Secondary | ICD-10-CM | POA: Diagnosis not present

## 2016-04-11 DIAGNOSIS — Z1159 Encounter for screening for other viral diseases: Secondary | ICD-10-CM | POA: Diagnosis not present

## 2016-04-11 DIAGNOSIS — Z1389 Encounter for screening for other disorder: Secondary | ICD-10-CM | POA: Diagnosis not present

## 2016-04-11 DIAGNOSIS — I2510 Atherosclerotic heart disease of native coronary artery without angina pectoris: Secondary | ICD-10-CM | POA: Diagnosis not present

## 2016-04-16 ENCOUNTER — Ambulatory Visit (INDEPENDENT_AMBULATORY_CARE_PROVIDER_SITE_OTHER): Payer: PPO | Admitting: Cardiovascular Disease

## 2016-04-16 ENCOUNTER — Encounter: Payer: Self-pay | Admitting: Cardiovascular Disease

## 2016-04-16 ENCOUNTER — Other Ambulatory Visit: Payer: Self-pay | Admitting: Cardiovascular Disease

## 2016-04-16 VITALS — BP 120/58 | HR 65 | Ht 61.0 in | Wt 266.0 lb

## 2016-04-16 DIAGNOSIS — I10 Essential (primary) hypertension: Secondary | ICD-10-CM

## 2016-04-16 DIAGNOSIS — I2510 Atherosclerotic heart disease of native coronary artery without angina pectoris: Secondary | ICD-10-CM | POA: Diagnosis not present

## 2016-04-16 DIAGNOSIS — R609 Edema, unspecified: Secondary | ICD-10-CM

## 2016-04-16 DIAGNOSIS — I4510 Unspecified right bundle-branch block: Secondary | ICD-10-CM

## 2016-04-16 DIAGNOSIS — G4733 Obstructive sleep apnea (adult) (pediatric): Secondary | ICD-10-CM

## 2016-04-16 DIAGNOSIS — E785 Hyperlipidemia, unspecified: Secondary | ICD-10-CM

## 2016-04-16 DIAGNOSIS — Z9989 Dependence on other enabling machines and devices: Secondary | ICD-10-CM

## 2016-04-16 DIAGNOSIS — M25473 Effusion, unspecified ankle: Secondary | ICD-10-CM

## 2016-04-16 MED ORDER — HYDROCHLOROTHIAZIDE 25 MG PO TABS: ORAL_TABLET | ORAL | Status: DC

## 2016-04-16 NOTE — Patient Instructions (Signed)
Your physician has recommended you make the following change in your medication:   Start new HCTZ prescription as directed.  Your physician has requested that you have an echocardiogram. Echocardiography is a painless test that uses sound waves to create images of your heart. It provides your doctor with information about the size and shape of your heart and how well your heart's chambers and valves are working. This procedure takes approximately one hour. There are no restrictions for this procedure.   Your physician wants you to follow-up in: 1 year or sooner if needed. You will receive a reminder letter in the mail two months in advance. If you don't receive a letter, please call our office to schedule the follow-up appointment.  If you need a refill on your cardiac medications before your next appointment, please call your pharmacy.

## 2016-04-18 ENCOUNTER — Encounter: Payer: Self-pay | Admitting: Cardiovascular Disease

## 2016-04-18 NOTE — Progress Notes (Signed)
Patient ID: [name] Mcgruder, female   DOB: 06/08/1951, 65 y.o.   MRN: [id number]      Primary M.Swanson.: Dr. Merri Brunetteandace Smith   HPI: [name] Hinton is a 65 y.o. female who presents to the office for a 16 month follow-up cardiology evaluation.  Ms. Perlie GoldRussell has a history of coronary artery disease in October 2008 underwent CABG surgery by Dr. Laneta SimmersBartle with a LIMA to the LAD, vein to the obtuse marginal, vein to the RCA. Her last nuclear perfusion study in October 2013 was unchanged and showed mild inferior abnormality.  She has a history of moderate obstructive sleep apnea which is severe during REM sleep. She was initially started on CPAP therapy 2010 but when I  saw her in April 2014 she had not been using therapy for over the past year. At that time, I strongly advise reinstitution of CPAP therapy.  She now uses [employer] as her DME company.  She recently received a new mask, which is a full face mask.  She admits to 100% compliance.  She is unaware of breakthrough snoring.  She denies excessive daytime sleepiness.  Additional problems include hyperlipidemia, right bundle branch block, systemic lupus erythematosus, as well as obesity.  She sees Dr. Dierdre ForthBeekman for her lupus.  She tells me she had blood work done by her primary physician last week.  She has noticed ankle swelling bilaterally. She presently denies any chest pain. He denies shortness of breath. She is unaware of palpitations.  She presents for evaluation.   Past Medical History  Diagnosis Date  . Hypertension   . Hyperlipidemia   . Sleep apnea   . CAD (coronary artery disease)   . OSA (obstructive sleep apnea)   . RBBB   . Systemic lupus erythematosus (HCC)   . Obesity     Past Surgical History  Procedure Laterality Date  . Cardiac catheterization  09/02/2007  . Coronary angioplasty    . Coronary artery bypass graft  09/02/2007    LIMA to the LAD,SVG to obtuse marginal branch of the left CX,SVG to the RCA    Allergies    Allergen Reactions  . Aspirin Nausea And Vomiting  . Meloxicam Hives    Current Outpatient Prescriptions  Medication Sig Dispense Refill  . benazepril (LOTENSIN) 10 MG tablet TAKE 1 TABLET BY MOUTH EVERY DAY 30 tablet 11  . hydroxychloroquine (PLAQUENIL) 200 MG tablet Take by mouth daily.    . NON FORMULARY CPAP therapy    . OVER THE COUNTER MEDICATION Take 1 capsule by mouth daily. Complete omega    . OVER THE COUNTER MEDICATION Take 1 tablet by mouth daily. Active Adult 50 plus    . simvastatin (ZOCOR) 40 MG tablet TAKE 1 TABLET BY MOUTH EVERY DAY 30 tablet 10  . hydrochlorothiazide (HYDRODIURIL) 25 MG tablet Take 1/2-1  Tablet  needed 30 tablet 6  . metoprolol (LOPRESSOR) 50 MG tablet TAKE 1 TABLET BY MOUTH TWICE A DAY. CONTACT DOCTOR'S OFFICE FOR ADDITIONAL REFILLS 180 tablet 3   No current facility-administered medications for this visit.    Social History   Social History  . Marital Status: Married    Spouse Name: N/A  . Number of Children: N/A  . Years of Education: N/A   Occupational History  . Not on file.   Social History Main Topics  . Smoking status: Never Smoker   . Smokeless tobacco: Never Used  . Alcohol Use: No  . Drug Use: No  . Sexual Activity:  Not on file   Other Topics Concern  . Not on file   Social History Narrative   Social history is notable that she is married has one child one deceased. She does not routinely exercise. He denies alcohol use or tobacco.   Family History  Problem Relation Age of Onset  . Heart disease Mother   . Heart attack Mother   . Hypertension Mother   . Hyperlipidemia Mother   . Cancer - Other Mother     breast  . Heart attack Father   . Heart disease Father   . Hyperlipidemia Father   . Hypertension Father   . Cancer - Other Sister     thyroid  . Diabetes Brother   . Cancer - Other Sister     breast  . Cancer - Other Sister     breast;  had mastectomy  . Hypertension Sister    ROS General: Negative;  No fevers, chills, or night sweats;  HEENT: Negative; No changes in vision or hearing, sinus congestion, difficulty swallowing Pulmonary: Negative; No cough, wheezing, shortness of breath, hemoptysis Cardiovascular: Negative; No chest pain, presyncope, syncope, palpitations Positive for ankle swelling GI: Negative; No nausea, vomiting, diarrhea, or abdominal pain GU: Negative; No dysuria, hematuria, or difficulty voiding Musculoskeletal: Negative; no myalgias, joint pain, or weakness Rheumatologic c: Positive for systemic lupus erythematosus Hematologic/Oncology: Negative; no easy bruising, bleeding Endocrine: Negative; no heat/cold intolerance; no diabetes Neuro: Negative; no changes in balance, headaches Skin: Negative; No rashes or skin lesions Psychiatric: Negative; No behavioral problems, depression Sleep: Positive for obstructive sleep apnea, now on CPAP with 100% compliance.  No snoring, daytime sleepiness, hypersomnolence, bruxism, restless legs, hypnogognic hallucinations, no cataplexy Other comprehensive 14 point system review is negative.   PE BP 120/58 mmHg  Pulse 65  Ht  (1.549 m)  Wt 266 lb (120.657 kg)  BMI 50.29 kg/m2   Repeat blood pressure by me 118/70.  Wt Readings from Last 3 Encounters:  04/16/16 266 lb (120.657 kg)  02/04/15 265 lb 6.4 oz (120.385 kg)  09/17/13 256 lb 1.6 oz (116.166 kg)   Body mass index is compatible with super morbid obesity General: Alert, oriented, no distress.  Skin: normal turgor, no rashes HEENT: Normocephalic, atraumatic. Pupils round and reactive; sclera anicteric;no lid lag.  Nose without nasal septal hypertrophy Mouth/Parynx benign; upper dentures; Mallinpatti scale 4 Neck: No JVD, no carotid bruits with normal carotid upstroke Chest wall: Nontender to palpation Lungs: clear to ausculatation and percussion; no wheezing or rales Heart: RRR, s1 s2 normal 1/6 systolic murmur; no diastolic murmur.  No rubs thrills or  heaves. Abdomen: soft, nontender; no hepatosplenomehaly, BS+; abdominal aorta nontender and not dilated by palpation. Back: No CVA tenderness Pulses 2+ Extremities: 2+  ankle edema; no clubbing cyanosis, Homan's sign negative  Neurologic: grossly nonfocal Psychologic: normal affect and mood.  ECG (independently read by me): Normal sinus rhythm at 65 bpm.  Right bundle-branch block with repolarization changes.  Small nondiagnostic inferior Q waves.  T-wave abnormality laterally.  ECG (independently read by me, and (: Normal sinus rhythm at 71 beats per minute.  Right bundle branch block with repolarization changes.  Q wave in lead 3.  November 2014 ECG: Sinus rhythm with right bundle branch block. First degree AV block with a PR interval of 208 ms   LABS:  BMP Latest Ref Rng 09/17/2013 02/20/2011 10/19/2010  Glucose 70 - 99 mg/dL 295(A) 213(Y) 865(H)  BUN 6 - 23 mg/dL 14 17 14  Creatinine 0.50 - 1.10 mg/dL 1.61 0.96 0.45  Sodium 135 - 145 mEq/L 138 141 136  Potassium 3.5 - 5.3 mEq/L 4.0 3.9 4.0  Chloride 96 - 112 mEq/L 104 104 102  CO2 19 - 32 mEq/L 26 27 25   Calcium 8.4 - 10.5 mg/dL 9.5 9.6 9.4       Component Value Date/Time   PROT 8.3 09/17/2013 0956   ALBUMIN 4.4 09/17/2013 0956   AST 32 09/17/2013 0956   ALT 19 09/17/2013 0956   ALKPHOS 76 09/17/2013 0956   BILITOT 0.4 09/17/2013 0956     CBC Latest Ref Rng 09/17/2013 02/20/2011 10/19/2010  WBC 4.0 - 10.5 K/uL 5.2 4.2 4.7  Hemoglobin 12.0 - 15.0 g/dL 40.9 81.1 91.4  Hematocrit 36.0 - 46.0 % 43.2 45.3 43.9  Platelets 150 - 400 K/uL 205 178 193       Component Value Date/Time   PROBNP 858.0* 09/09/2007 0340       Component Value Date/Time   CHOL 150 02/04/2015 0957   TRIG 100 02/04/2015 0957   HDL 62 02/04/2015 0957   CHOLHDL 2.4 02/04/2015 0957   VLDL 20 02/04/2015 0957   LDLCALC 68 02/04/2015 0957     RADIOLOGY: No results found.    ASSESSMENT AND PLAN: Ms. Kosak is a 65 year old female who is  almost 9 years status post CABG revascularization surgery which was done in October 2008.[name] Kitchen  Her last nuclear perfusion study was unchanged over previous study and showed mild inferior abnormality.  Her blood pressure today is well controlled on metoprolol 50 mg twice a day and benazepril 10 mg daily.  She has obstructive sleep apnea and continues to feel well and now admits to 100% compliance with reference to CPAP therapy.  He has established with a new DME  company which is now [employer].  She denies breakthrough snoring.  She denies excessive daytime sleepiness.  She has lupus and is on plaque with female and is followed by Dr. Dierdre Forth.  She has a history of hyperlipidemia and is on simvastatin 40 mg daily.  She denies myalgias.  She remains significantly obese and meets criteria for super morbid obesity with a BMI index of 50.29 kg/m.  We discussed the importance of weight loss and increased activity.  In the past, we had discussed the possibility of bariatric surgery, but she is against this.  She had blood work done this past week.  I will try to obtain these results were primary physician.  Her ECG remained stable.  I am recommending a follow-up echo Doppler study to evaluate systolic and diastolic function.  I'm adding HCTZ 12.5-25 mg as needed for her ankle edema.  I will see her in in one year for reevaluation or sooner from's arise.  Time spent: 25 minutes  Lennette Bihari, MD, [facility]  04/18/2016 9:20 AM

## 2016-04-20 ENCOUNTER — Encounter: Payer: Self-pay | Admitting: Cardiovascular Disease

## 2016-05-03 ENCOUNTER — Ambulatory Visit (HOSPITAL_COMMUNITY): Payer: PPO | Attending: Cardiology

## 2016-05-03 ENCOUNTER — Other Ambulatory Visit: Payer: Self-pay

## 2016-05-03 DIAGNOSIS — I10 Essential (primary) hypertension: Secondary | ICD-10-CM | POA: Diagnosis not present

## 2016-05-03 DIAGNOSIS — Z8249 Family history of ischemic heart disease and other diseases of the circulatory system: Secondary | ICD-10-CM | POA: Diagnosis not present

## 2016-05-03 DIAGNOSIS — Z951 Presence of aortocoronary bypass graft: Secondary | ICD-10-CM | POA: Diagnosis not present

## 2016-05-03 DIAGNOSIS — M329 Systemic lupus erythematosus, unspecified: Secondary | ICD-10-CM | POA: Diagnosis not present

## 2016-05-03 DIAGNOSIS — Z6841 Body Mass Index (BMI) 40.0 and over, adult: Secondary | ICD-10-CM | POA: Insufficient documentation

## 2016-05-03 DIAGNOSIS — E785 Hyperlipidemia, unspecified: Secondary | ICD-10-CM | POA: Diagnosis not present

## 2016-05-03 DIAGNOSIS — I4510 Unspecified right bundle-branch block: Secondary | ICD-10-CM | POA: Diagnosis not present

## 2016-05-03 DIAGNOSIS — I119 Hypertensive heart disease without heart failure: Secondary | ICD-10-CM | POA: Insufficient documentation

## 2016-05-03 DIAGNOSIS — G4733 Obstructive sleep apnea (adult) (pediatric): Secondary | ICD-10-CM | POA: Diagnosis not present

## 2016-05-03 DIAGNOSIS — E669 Obesity, unspecified: Secondary | ICD-10-CM | POA: Insufficient documentation

## 2016-05-03 DIAGNOSIS — I2510 Atherosclerotic heart disease of native coronary artery without angina pectoris: Secondary | ICD-10-CM | POA: Insufficient documentation

## 2016-05-17 DIAGNOSIS — G4733 Obstructive sleep apnea (adult) (pediatric): Secondary | ICD-10-CM | POA: Diagnosis not present

## 2016-05-24 DIAGNOSIS — R899 Unspecified abnormal finding in specimens from other organs, systems and tissues: Secondary | ICD-10-CM | POA: Diagnosis not present

## 2016-05-24 DIAGNOSIS — M329 Systemic lupus erythematosus, unspecified: Secondary | ICD-10-CM | POA: Diagnosis not present

## 2016-05-24 DIAGNOSIS — M170 Bilateral primary osteoarthritis of knee: Secondary | ICD-10-CM | POA: Diagnosis not present

## 2016-05-24 DIAGNOSIS — M79604 Pain in right leg: Secondary | ICD-10-CM | POA: Diagnosis not present

## 2016-05-24 DIAGNOSIS — M25561 Pain in right knee: Secondary | ICD-10-CM | POA: Diagnosis not present

## 2016-07-17 ENCOUNTER — Other Ambulatory Visit: Payer: Self-pay | Admitting: Family Medicine

## 2016-07-17 DIAGNOSIS — Z1231 Encounter for screening mammogram for malignant neoplasm of breast: Secondary | ICD-10-CM

## 2016-07-20 ENCOUNTER — Ambulatory Visit
Admission: RE | Admit: 2016-07-20 | Discharge: 2016-07-20 | Disposition: A | Discharge status: Home / Self Care | Payer: PPO | Source: Ambulatory Visit | Attending: Family Medicine | Admitting: Family Medicine

## 2016-07-20 DIAGNOSIS — Z1231 Encounter for screening mammogram for malignant neoplasm of breast: Secondary | ICD-10-CM

## 2016-08-08 DIAGNOSIS — M170 Bilateral primary osteoarthritis of knee: Secondary | ICD-10-CM | POA: Diagnosis not present

## 2016-08-08 DIAGNOSIS — M329 Systemic lupus erythematosus, unspecified: Secondary | ICD-10-CM | POA: Diagnosis not present

## 2016-08-08 DIAGNOSIS — R899 Unspecified abnormal finding in specimens from other organs, systems and tissues: Secondary | ICD-10-CM | POA: Diagnosis not present

## 2016-08-08 DIAGNOSIS — M79604 Pain in right leg: Secondary | ICD-10-CM | POA: Diagnosis not present

## 2016-10-17 DIAGNOSIS — Z951 Presence of aortocoronary bypass graft: Secondary | ICD-10-CM | POA: Diagnosis not present

## 2016-10-17 DIAGNOSIS — E7800 Pure hypercholesterolemia, unspecified: Secondary | ICD-10-CM | POA: Diagnosis not present

## 2016-10-17 DIAGNOSIS — I2510 Atherosclerotic heart disease of native coronary artery without angina pectoris: Secondary | ICD-10-CM | POA: Diagnosis not present

## 2016-10-17 DIAGNOSIS — Z23 Encounter for immunization: Secondary | ICD-10-CM | POA: Diagnosis not present

## 2016-11-10 ENCOUNTER — Other Ambulatory Visit: Payer: Self-pay | Admitting: Cardiovascular Disease

## 2016-11-12 NOTE — Telephone Encounter (Signed)
Rx request sent to pharmacy.  

## 2016-12-20 DIAGNOSIS — E7800 Pure hypercholesterolemia, unspecified: Secondary | ICD-10-CM | POA: Diagnosis not present

## 2017-01-16 DIAGNOSIS — G4733 Obstructive sleep apnea (adult) (pediatric): Secondary | ICD-10-CM | POA: Diagnosis not present

## 2017-02-18 DIAGNOSIS — M79604 Pain in right leg: Secondary | ICD-10-CM | POA: Diagnosis not present

## 2017-02-18 DIAGNOSIS — R899 Unspecified abnormal finding in specimens from other organs, systems and tissues: Secondary | ICD-10-CM | POA: Diagnosis not present

## 2017-02-18 DIAGNOSIS — Z6841 Body Mass Index (BMI) 40.0 and over, adult: Secondary | ICD-10-CM | POA: Diagnosis not present

## 2017-02-18 DIAGNOSIS — M329 Systemic lupus erythematosus, unspecified: Secondary | ICD-10-CM | POA: Diagnosis not present

## 2017-02-18 DIAGNOSIS — M170 Bilateral primary osteoarthritis of knee: Secondary | ICD-10-CM | POA: Diagnosis not present

## 2017-04-16 DIAGNOSIS — Z23 Encounter for immunization: Secondary | ICD-10-CM | POA: Diagnosis not present

## 2017-04-16 DIAGNOSIS — Z1389 Encounter for screening for other disorder: Secondary | ICD-10-CM | POA: Diagnosis not present

## 2017-04-16 DIAGNOSIS — I2510 Atherosclerotic heart disease of native coronary artery without angina pectoris: Secondary | ICD-10-CM | POA: Diagnosis not present

## 2017-04-16 DIAGNOSIS — M329 Systemic lupus erythematosus, unspecified: Secondary | ICD-10-CM | POA: Diagnosis not present

## 2017-04-16 DIAGNOSIS — E7800 Pure hypercholesterolemia, unspecified: Secondary | ICD-10-CM | POA: Diagnosis not present

## 2017-04-16 DIAGNOSIS — Z0000 Encounter for general adult medical examination without abnormal findings: Secondary | ICD-10-CM | POA: Diagnosis not present

## 2017-04-20 ENCOUNTER — Other Ambulatory Visit: Payer: Self-pay | Admitting: Cardiovascular Disease

## 2017-04-22 NOTE — Telephone Encounter (Signed)
Rx(s) sent to pharmacy electronically.  

## 2017-05-20 ENCOUNTER — Other Ambulatory Visit: Payer: Self-pay | Admitting: Cardiovascular Disease

## 2017-06-13 DIAGNOSIS — H353131 Nonexudative age-related macular degeneration, bilateral, early dry stage: Secondary | ICD-10-CM | POA: Diagnosis not present

## 2017-06-13 DIAGNOSIS — H2513 Age-related nuclear cataract, bilateral: Secondary | ICD-10-CM | POA: Diagnosis not present

## 2017-06-13 DIAGNOSIS — H25013 Cortical age-related cataract, bilateral: Secondary | ICD-10-CM | POA: Diagnosis not present

## 2017-06-13 DIAGNOSIS — H40013 Open angle with borderline findings, low risk, bilateral: Secondary | ICD-10-CM | POA: Diagnosis not present

## 2017-06-21 ENCOUNTER — Other Ambulatory Visit: Payer: Self-pay | Admitting: Cardiovascular Disease

## 2017-08-08 ENCOUNTER — Ambulatory Visit (INDEPENDENT_AMBULATORY_CARE_PROVIDER_SITE_OTHER): Payer: PPO | Admitting: Cardiovascular Disease

## 2017-08-08 ENCOUNTER — Encounter: Payer: Self-pay | Admitting: Cardiovascular Disease

## 2017-08-08 VITALS — BP 142/62 | HR 73 | Ht 61.5 in | Wt 261.2 lb

## 2017-08-08 DIAGNOSIS — Z951 Presence of aortocoronary bypass graft: Secondary | ICD-10-CM | POA: Diagnosis not present

## 2017-08-08 DIAGNOSIS — Z79899 Other long term (current) drug therapy: Secondary | ICD-10-CM

## 2017-08-08 DIAGNOSIS — I5189 Other ill-defined heart diseases: Secondary | ICD-10-CM

## 2017-08-08 DIAGNOSIS — Z9989 Dependence on other enabling machines and devices: Secondary | ICD-10-CM

## 2017-08-08 DIAGNOSIS — R079 Chest pain, unspecified: Secondary | ICD-10-CM

## 2017-08-08 DIAGNOSIS — I4510 Unspecified right bundle-branch block: Secondary | ICD-10-CM

## 2017-08-08 DIAGNOSIS — I519 Heart disease, unspecified: Secondary | ICD-10-CM

## 2017-08-08 DIAGNOSIS — E785 Hyperlipidemia, unspecified: Secondary | ICD-10-CM | POA: Diagnosis not present

## 2017-08-08 DIAGNOSIS — I10 Essential (primary) hypertension: Secondary | ICD-10-CM

## 2017-08-08 DIAGNOSIS — G4733 Obstructive sleep apnea (adult) (pediatric): Secondary | ICD-10-CM

## 2017-08-08 MED ORDER — ROSUVASTATIN CALCIUM 20 MG PO TABS: 20.0000 mg | ORAL_TABLET | Freq: Every day | ORAL | 3 refills | Status: DC

## 2017-08-08 NOTE — Patient Instructions (Signed)
Medication Instructions:  STOP simvastatin  START rosuvastatin (Crestor) 20 mg daily  Labwork: Please return for FASTING labs in 2 MONTHS (CMET, CBC, Lipid, TSH)  Our in office lab hours are Monday-Friday 8:00-4:30, closed for lunch 1-2 pm.  No appointment needed.  Testing/Procedures: Your physician has requested that you have a lexiscan myoview. For further information please visit [email or web address] Please follow instruction sheet, as given.  Follow-Up: Your physician recommends that you schedule a follow-up appointment in: 2-3 MONTHS with Dr. Tresa Endo.    Any Other Special Instructions Will Be Listed Below (If Applicable).     If you need a refill on your cardiac medications before your next appointment, please call your pharmacy.

## 2017-08-08 NOTE — Progress Notes (Signed)
Patient ID: [name], female   DOB: [birth year], 66 y.o.   MRN: [id number]      Primary M.D.: Dr. Merri Brunette   HPI: [name] is a 66 y.o. female who presents to the office for a 16 month follow-up cardiology evaluation.  [name] has a history of coronary artery disease in October 2008 underwent CABG surgery by Dr. Laneta Simmers with a LIMA to the LAD, vein to the obtuse marginal, vein to the RCA. Her last nuclear perfusion study in October 2013 was unchanged and showed mild inferior abnormality.  She has a history of moderate obstructive sleep apnea which is severe during REM sleep. She was initially started on CPAP therapy 2010 but when I  saw her in April 2014 she had not been using therapy for over the past year. At that time, I strongly advise reinstitution of CPAP therapy.  She now uses [employer] as her DME company.  She recently received a new mask, which is a full face mask.  She admits to 100% compliance.  She is unaware of breakthrough snoring.  She denies excessive daytime sleepiness.  Additional problems include hyperlipidemia, right bundle branch block, systemic lupus erythematosus, as well as obesity.  She sees Dr. Dierdre Forth for her lupus.  Since I saw her, she states that several weeks ago, she experienced chest tightness which lasted for several minutes.  This occurred non-exertionally.  She has not been very active due to bilateral knee pain.  She denies any significant shortness of breath and is not aware of arrhythmias.  She continues to use CPAP but has not had a download in a long time.  In June 2017.  An echo Doppler study showed an EF of 60-65% with grade 2 diastolic dysfunction.  She is on benazepril 10 mg and metoprolol 50 mg twice a day and has a prescription for HCTZ, which she takes as needed for swelling.  She also has been on simvastatin 40 mg for hyperlipidemia.  Lab work done by her primary physician in June 2018 showed a total cholesterol 187, HDL 61, and LDL  cholesterol was 104 with triglycerides 108.  She presents for evaluation  Past Medical History:  Diagnosis Date  . CAD (coronary artery disease)   . Hyperlipidemia   . Hypertension   . Obesity   . OSA (obstructive sleep apnea)   . RBBB   . Sleep apnea   . Systemic lupus erythematosus (HCC)     Past Surgical History:  Procedure Laterality Date  . CARDIAC CATHETERIZATION  09/02/2007  . CORONARY ANGIOPLASTY    . CORONARY ARTERY BYPASS GRAFT  09/02/2007   LIMA to the LAD,SVG to obtuse marginal branch of the left CX,SVG to the RCA    Allergies  Allergen Reactions  . Aspirin Nausea And Vomiting  . Meloxicam Hives    Current Outpatient Prescriptions  Medication Sig Dispense Refill  . benazepril (LOTENSIN) 10 MG tablet TAKE 1 TABLET BY MOUTH EVERY DAY 30 tablet 11  . hydrochlorothiazide (HYDRODIURIL) 25 MG tablet Take 1/2-1  Tablet  needed 30 tablet 6  . hydroxychloroquine (PLAQUENIL) 200 MG tablet Take by mouth daily.    . metoprolol tartrate (LOPRESSOR) 50 MG tablet TAKE 1 TABLET BY MOUTH TWICE A DAY 60 tablet 0  . NON FORMULARY CPAP therapy    . OVER THE COUNTER MEDICATION Take 1 capsule by mouth daily. Complete omega    . OVER THE COUNTER MEDICATION Take 1 tablet by mouth daily. Active Adult 73  plus    . rosuvastatin (CRESTOR) 20 MG tablet Take 1 tablet (20 mg total) by mouth daily. 90 tablet 3   No current facility-administered medications for this visit.     Social History   Social History  . Marital status: Married    Spouse name: N/A  . Number of children: N/A  . Years of education: N/A   Occupational History  . Not on file.   Social History Main Topics  . Smoking status: Never Smoker  . Smokeless tobacco: Never Used  . Alcohol use No  . Drug use: No  . Sexual activity: Not on file   Other Topics Concern  . Not on file   Social History Narrative  . No narrative on file   Social history is notable that she is married has one child one deceased. She  does not routinely exercise. He denies alcohol use or tobacco.   Family History  Problem Relation Age of Onset  . Heart disease Mother   . Heart attack Mother   . Hypertension Mother   . Hyperlipidemia Mother   . Cancer - Other Mother        breast  . Heart attack Father   . Heart disease Father   . Hyperlipidemia Father   . Hypertension Father   . Cancer - Other Sister        thyroid  . Diabetes Brother   . Cancer - Other Sister        breast  . Cancer - Other Sister        breast;  had mastectomy  . Hypertension Sister    ROS General: Negative; No fevers, chills, or night sweats; Positive for morbid obesity HEENT: Negative; No changes in vision or hearing, sinus congestion, difficulty swallowing Pulmonary: Negative; No cough, wheezing, shortness of breath, hemoptysis Cardiovascular: See history of present illness Positive for ankle swelling, intermittently GI: Negative; No nausea, vomiting, diarrhea, or abdominal pain GU: Negative; No dysuria, hematuria, or difficulty voiding Musculoskeletal: Negative; no myalgias, joint pain, or weakness Rheumatologic c: Positive for systemic lupus erythematosus Hematologic/Oncology: Negative; no easy bruising, bleeding Endocrine: Negative; no heat/cold intolerance; no diabetes Neuro: Negative; no changes in balance, headaches Skin: Negative; No rashes or skin lesions Psychiatric: Negative; No behavioral problems, depression Sleep: Positive for obstructive sleep apnea, now on CPAP with 100% compliance.  No snoring, daytime sleepiness, hypersomnolence, bruxism, restless legs, hypnogognic hallucinations, no cataplexy Other comprehensive 14 point system review is negative.   PE BP (!) 142/62   Pulse 73   Ht 5' 1.5" (1.562 m)   Wt 261 lb 3.2 oz (118.5 kg)   BMI 48.55 kg/m    Repeat blood pressure 134/68  Wt Readings from Last 3 Encounters:  08/08/17 261 lb 3.2 oz (118.5 kg)  04/16/16 266 lb (120.7 kg)  02/04/15 265 lb 6.4 oz  (120.4 kg)   General: Alert, oriented, no distress.  Skin: normal turgor, no rashes, warm and dry HEENT: Normocephalic, atraumatic. Pupils equal round and reactive to light; sclera anicteric; extraocular muscles intact;  Nose without nasal septal hypertrophy Mouth/Parynx benign; Mallinpatti scale 4 Neck: No JVD, no carotid bruits; normal carotid upstroke Lungs: clear to ausculatation and percussion; no wheezing or rales Chest wall: without tenderness to palpitation Heart: PMI not displaced, RRR, s1 s2 normal, 1/6 systolic murmur, no diastolic murmur, no rubs, gallops, thrills, or heaves Abdomen: soft, nontender; no hepatosplenomehaly, BS+; abdominal aorta nontender and not dilated by palpation. Back: no CVA tenderness Pulses 2+ Musculoskeletal:  full range of motion, normal strength, no joint deformities Extremities: no clubbing cyanosis or edema, Homan's sign negative  Neurologic: grossly nonfocal; Cranial nerves grossly wnl Psychologic: Normal mood and affect   ECG (independently read by me): Normal sinus rhythm at 73 bpm.  Right bundle branch block with repolarization changes.  QTc interval 460 ms.  June 2017 ECG (independently read by me): Normal sinus rhythm at 65 bpm.  Right bundle-branch block with repolarization changes.  Small nondiagnostic inferior Q waves.  T-wave abnormality laterally.  October 2016 ECG (independently read by me, and (: Normal sinus rhythm at 71 beats per minute.  Right bundle branch block with repolarization changes.  Q wave in lead 3.  November 2014 ECG: Sinus rhythm with right bundle branch block. First degree AV block with a PR interval of 208 ms   LABS:  BMP Latest Ref Rng & Units 09/17/2013 02/20/2011 10/19/2010  Glucose 70 - 99 mg/dL 161(W) 960(A) 540(J)  BUN 6 - 23 mg/dL Creatinine 0.50 - 1.10 mg/dL 8.11 9.14 7.82  Sodium 135 - 145 mEq/L 138 141 136  Potassium 3.5 - 5.3 mEq/L 4.0 3.9 4.0  Chloride 96 - 112 mEq/L 104 104 102  CO2 19 -  32 mEq/L Calcium 8.4 - 10.5 mg/dL 9.5 9.6 9.4       Component Value Date/Time   PROT 8.3 09/17/2013 0956   ALBUMIN 4.4 09/17/2013 0956   AST 32 09/17/2013 0956   ALT 19 09/17/2013 0956   ALKPHOS 76 09/17/2013 0956   BILITOT 0.4 09/17/2013 0956     CBC Latest Ref Rng & Units 09/17/2013 02/20/2011 10/19/2010  WBC 4.0 - 10.5 K/uL 5.2 4.2 4.7  Hemoglobin 12.0 - 15.0 g/dL 95.6 21.3 08.6  Hematocrit 36.0 - 46.0 % 43.2 45.3 43.9  Platelets 150 - 400 K/uL 205 178 193       Component Value Date/Time   PROBNP 858.0 (H) 09/09/2007 0340       Component Value Date/Time   CHOL 150 02/04/2015 0957   TRIG 100 02/04/2015 0957   HDL 62 02/04/2015 0957   CHOLHDL 2.4 02/04/2015 0957   VLDL 20 02/04/2015 0957   LDLCALC 68 02/04/2015 0957     RADIOLOGY: No results found.  IMPRESSION:  1. S/P CABG (coronary artery bypass graft)   2. Chest pain, unspecified type   3. Hyperlipidemia with target LDL less than 70   4. Essential hypertension, benign   5. Medication management   6. Grade II diastolic dysfunction   7. Morbid obesity (HCC)   8. RBBB   9. OSA on CPAP     ASSESSMENT AND PLAN: [name] is a 66 year old female who is status post CABG revascularization surgery  in October 2008.[name] Kitchen  Her last nuclear perfusion study was unchanged over previous study and showed mild inferior abnormality.  She continues to be on metoprolol 50 mg twice a day and benazepril 10 mg daily .  4.  Hypertension.  Blood pressure today was minimally increased based on new criteria.  She has not been taking HCTZ.  Recently, she had experienced an episode of chest tightness which lasted for several minutes.  He was not classically exertional.  She has not had a stress test in many years and I have recommended with her risk factors that she undergo a Lexiscan Myoview study now 10 years status post CABG revascularization..  She has obstructive sleep apnea and continues to feel well and now admits  to 100%  compliance with reference to CPAP therapy.  Her DME company is [employer].  She denies breakthrough snoring.  She denies excessive daytime sleepiness.  She has lupus and is on plaquenil and is followed by Dr. Dierdre Forth.  He has a history of hyperlipidemia and has been maintained on simvastatin 40 mg.  I obtained results of her lipids from June 2018.  LDL is 100.  With her CAD, and diabetes mellitus, target LDL is less than 70.  I have recommended she discontinue simvastatin and will initiate Crestor, initially at 20 mg in attempt to be more aggressive and reached target.  This dose can be further increased to 40 mg if this target is not achieved.  Repeat laboratory will be obtained in several months.  Reviewed her echo Doppler study from June 2017 which showed normal systolic function but grade 2 diastolic dysfunction.  She did not have significant valvular abnormalities.  Her ECG reveals right bundle branch block, which is stable. I discussed the importance of weight loss with her morbdobity and increased exercise if at all possible.  I will see her in the office for follow-up evaluation.   Time spent: 25 minutes  Lennette Bihari, MD, [facility]  08/10/2017 10:38 PM

## 2017-08-14 ENCOUNTER — Ambulatory Visit (HOSPITAL_COMMUNITY): Payer: PPO

## 2017-08-15 ENCOUNTER — Inpatient Hospital Stay (HOSPITAL_COMMUNITY): Admission: RE | Admit: 2017-08-15 | Payer: PPO | Source: Ambulatory Visit

## 2017-08-20 ENCOUNTER — Telehealth (HOSPITAL_COMMUNITY): Payer: Self-pay

## 2017-08-20 DIAGNOSIS — M329 Systemic lupus erythematosus, unspecified: Secondary | ICD-10-CM | POA: Diagnosis not present

## 2017-08-20 DIAGNOSIS — M79604 Pain in right leg: Secondary | ICD-10-CM | POA: Diagnosis not present

## 2017-08-20 DIAGNOSIS — Z6841 Body Mass Index (BMI) 40.0 and over, adult: Secondary | ICD-10-CM | POA: Diagnosis not present

## 2017-08-20 DIAGNOSIS — R899 Unspecified abnormal finding in specimens from other organs, systems and tissues: Secondary | ICD-10-CM | POA: Diagnosis not present

## 2017-08-20 DIAGNOSIS — M170 Bilateral primary osteoarthritis of knee: Secondary | ICD-10-CM | POA: Diagnosis not present

## 2017-08-20 NOTE — Telephone Encounter (Signed)
Encounter complete. 

## 2017-08-22 ENCOUNTER — Ambulatory Visit (HOSPITAL_COMMUNITY)
Admission: RE | Admit: 2017-08-22 | Discharge: 2017-08-22 | Disposition: A | Discharge status: Home / Self Care | Payer: PPO | Source: Ambulatory Visit | Attending: Cardiology | Admitting: Cardiology

## 2017-08-22 DIAGNOSIS — R0609 Other forms of dyspnea: Secondary | ICD-10-CM | POA: Diagnosis not present

## 2017-08-22 DIAGNOSIS — I4510 Unspecified right bundle-branch block: Secondary | ICD-10-CM | POA: Diagnosis not present

## 2017-08-22 DIAGNOSIS — Z951 Presence of aortocoronary bypass graft: Secondary | ICD-10-CM | POA: Insufficient documentation

## 2017-08-22 DIAGNOSIS — R079 Chest pain, unspecified: Secondary | ICD-10-CM

## 2017-08-22 DIAGNOSIS — Z8249 Family history of ischemic heart disease and other diseases of the circulatory system: Secondary | ICD-10-CM | POA: Diagnosis not present

## 2017-08-22 DIAGNOSIS — I10 Essential (primary) hypertension: Secondary | ICD-10-CM | POA: Insufficient documentation

## 2017-08-22 DIAGNOSIS — I2510 Atherosclerotic heart disease of native coronary artery without angina pectoris: Secondary | ICD-10-CM | POA: Diagnosis not present

## 2017-08-22 DIAGNOSIS — E669 Obesity, unspecified: Secondary | ICD-10-CM | POA: Diagnosis not present

## 2017-08-22 DIAGNOSIS — Z6841 Body Mass Index (BMI) 40.0 and over, adult: Secondary | ICD-10-CM | POA: Insufficient documentation

## 2017-08-22 MED ORDER — TECHNETIUM TC 99M TETROFOSMIN IV KIT
30.5000 | PACK | Freq: Once | INTRAVENOUS | Status: AC | PRN
  Administered 2017-08-22: 30.5 via INTRAVENOUS
  Filled 2017-08-22: qty 31

## 2017-08-22 MED ORDER — REGADENOSON 0.4 MG/5ML IV SOLN
0.4000 mg | Freq: Once | INTRAVENOUS | Status: AC
  Administered 2017-08-22: 0.4 mg via INTRAVENOUS

## 2017-08-23 ENCOUNTER — Ambulatory Visit (HOSPITAL_COMMUNITY)
Admission: RE | Admit: 2017-08-23 | Discharge: 2017-08-23 | Disposition: A | Discharge status: Home / Self Care | Payer: PPO | Source: Ambulatory Visit | Attending: Cardiovascular Disease | Admitting: Cardiovascular Disease

## 2017-08-23 LAB — MYOCARDIAL PERFUSION IMAGING
LV dias vol: 95 mL (ref 46–106)
LV sys vol: 37 mL
Peak HR: 88 {beats}/min
Rest HR: 69 {beats}/min
SDS: 6
SRS: 0
SSS: 6
TID: 0.81

## 2017-08-23 MED ORDER — TECHNETIUM TC 99M TETROFOSMIN IV KIT
30.1000 | PACK | Freq: Once | INTRAVENOUS | Status: AC | PRN
  Administered 2017-08-23: 30.1 via INTRAVENOUS

## 2017-10-07 DIAGNOSIS — E785 Hyperlipidemia, unspecified: Secondary | ICD-10-CM | POA: Diagnosis not present

## 2017-10-07 DIAGNOSIS — Z951 Presence of aortocoronary bypass graft: Secondary | ICD-10-CM | POA: Diagnosis not present

## 2017-10-07 DIAGNOSIS — I10 Essential (primary) hypertension: Secondary | ICD-10-CM | POA: Diagnosis not present

## 2017-10-07 DIAGNOSIS — Z79899 Other long term (current) drug therapy: Secondary | ICD-10-CM | POA: Diagnosis not present

## 2017-10-08 LAB — COMPREHENSIVE METABOLIC PANEL
ALT: 23 IU/L (ref 0–32)
AST: 34 IU/L (ref 0–40)
Albumin/Globulin Ratio: 1.1 — ABNORMAL LOW (ref 1.2–2.2)
Albumin: 4.3 g/dL (ref 3.6–4.8)
Alkaline Phosphatase: 84 IU/L (ref 39–117)
BUN/Creatinine Ratio: 13 (ref 12–28)
BUN: 10 mg/dL (ref 8–27)
Bilirubin Total: 0.3 mg/dL (ref 0.0–1.2)
CO2: 24 mmol/L (ref 20–29)
Calcium: 9.6 mg/dL (ref 8.7–10.3)
Chloride: 105 mmol/L (ref 96–106)
Creatinine, Ser: 0.78 mg/dL (ref 0.57–1.00)
GFR calc Af Amer: 92 mL/min/{1.73_m2} (ref >59)
GFR calc non Af Amer: 79 mL/min/{1.73_m2} (ref >59)
Globulin, Total: 3.8 g/dL (ref 1.5–4.5)
Glucose: 91 mg/dL (ref 65–99)
Potassium: 4.5 mmol/L (ref 3.5–5.2)
Sodium: 144 mmol/L (ref 134–144)
Total Protein: 8.1 g/dL (ref 6.0–8.5)

## 2017-10-08 LAB — LIPID PANEL
Chol/HDL Ratio: 2.2 ratio (ref 0.0–4.4)
Cholesterol, Total: 148 mg/dL (ref 100–199)
HDL: 68 mg/dL (ref >39)
LDL Calculated: 66 mg/dL (ref 0–99)
Triglycerides: 68 mg/dL (ref 0–149)
VLDL Cholesterol Cal: 14 mg/dL (ref 5–40)

## 2017-10-08 LAB — CBC
Hematocrit: 41.7 % (ref 34.0–46.6)
Hemoglobin: 14 g/dL (ref 11.1–15.9)
MCH: 29.9 pg (ref 26.6–33.0)
MCHC: 33.6 g/dL (ref 31.5–35.7)
MCV: 89 fL (ref 79–97)
Platelets: 228 10*3/uL (ref 150–379)
RBC: 4.69 x10E6/uL (ref 3.77–5.28)
RDW: 13.7 % (ref 12.3–15.4)
WBC: 5.8 10*3/uL (ref 3.4–10.8)

## 2017-10-08 LAB — TSH: TSH: 2.82 u[IU]/mL (ref 0.450–4.500)

## 2017-10-11 ENCOUNTER — Ambulatory Visit: Payer: PPO | Admitting: Cardiovascular Disease

## 2017-10-11 ENCOUNTER — Encounter: Payer: Self-pay | Admitting: Cardiovascular Disease

## 2017-10-11 VITALS — BP 132/69 | HR 72 | Ht 61.5 in | Wt 261.0 lb

## 2017-10-11 DIAGNOSIS — I2510 Atherosclerotic heart disease of native coronary artery without angina pectoris: Secondary | ICD-10-CM

## 2017-10-11 DIAGNOSIS — Z951 Presence of aortocoronary bypass graft: Secondary | ICD-10-CM

## 2017-10-11 DIAGNOSIS — I4510 Unspecified right bundle-branch block: Secondary | ICD-10-CM | POA: Diagnosis not present

## 2017-10-11 DIAGNOSIS — E785 Hyperlipidemia, unspecified: Secondary | ICD-10-CM

## 2017-10-11 DIAGNOSIS — I10 Essential (primary) hypertension: Secondary | ICD-10-CM | POA: Diagnosis not present

## 2017-10-11 MED ORDER — METOPROLOL TARTRATE 50 MG PO TABS: 50.0000 mg | ORAL_TABLET | Freq: Two times a day (BID) | ORAL | 3 refills | Status: DC

## 2017-10-11 NOTE — Patient Instructions (Signed)
Medication Instructions:  Your physician recommends that you continue on your current medications as directed. Please refer to the Current Medication list given to you today.  Follow-Up: Your physician wants you to follow-up in: 12 months with Dr. Kelly.  You will receive a reminder letter in the mail two months in advance. If you don't receive a letter, please call our office to schedule the follow-up appointment.   If you need a refill on your cardiac medications before your next appointment, please call your pharmacy.   

## 2017-10-11 NOTE — Progress Notes (Signed)
Patient ID: [name], female   DOB: 10/31/1951, 66 y.o.   MRN: [id number]      Primary M.D.: Dr. Merri Brunetteandace Smith   HPI: [name] is a 66 y.o. female who presents to the office for a 14 month follow-up cardiology evaluation.  [name] has a history of coronary artery disease in October 2008 underwent CABG surgery by Dr. Laneta SimmersBartle with a LIMA to the LAD, vein to the obtuse marginal, vein to the RCA. Her last nuclear perfusion study in October 2013 was unchanged and showed mild inferior abnormality.  She has a history of moderate obstructive sleep apnea which is severe during REM sleep. She was initially started on CPAP therapy 2010 but when I  saw her in April 2014 she had not been using therapy for over the past year. At that time, I strongly advise reinstitution of CPAP therapy.  She now uses [employer] as her DME company.  She recently received a new mask, which is a full face mask.  She admits to 100% compliance.  She is unaware of breakthrough snoring.  She denies excessive daytime sleepiness.  Additional problems include hyperlipidemia, right bundle branch block, systemic lupus erythematosus, as well as obesity.  She sees Dr. Dierdre ForthBeekman for her lupus.  When I last saw her in October 2017, she had experienced an episode of nonexertional chest tightness which lasted for several minutes.   She had not been very active due to bilateral deep discomfort. She had not been very active due to bilateral knee pain.  In June 2017 an echo Doppler study showed an EF of 60-65% with grade 2 diastolic dysfunction.  Over the past year, she has continued to use CPAP with 100% compliance.  She has been on aspirin.  benazapril 10 mg, HCTZ as needed, and metoprolol 50 mg twice a day.  When I saw her last year, she was changed from simvastatin to rosuvastatin for more aggressive lipid intervention.  She continues to be on rosuvastatin 20 mg for hyperlipidemia.  She underwent a nuclear perfusion study for a 10  year follow-up evaluation in October 2018.  This remained low risk and showed a small inferobasilar defect, without associated ischemia and with akinesis of this info basal segment.  Ejection fraction was 61%.  She underwent laboratory on 10/07/2017 which showed a total cluster 148, HDL 68, LDL 66, and triglycerides 68.  She presents for a 14 month follow-up cardiology  evaluation   Past Medical History:  Diagnosis Date  . CAD (coronary artery disease)   . Hyperlipidemia   . Hypertension   . Obesity   . OSA (obstructive sleep apnea)   . RBBB   . Sleep apnea   . Systemic lupus erythematosus (HCC)     Past Surgical History:  Procedure Laterality Date  . CARDIAC CATHETERIZATION  09/02/2007  . CORONARY ANGIOPLASTY    . CORONARY ARTERY BYPASS GRAFT  09/02/2007   LIMA to the LAD,SVG to obtuse marginal branch of the left CX,SVG to the RCA    Allergies  Allergen Reactions  . Aspirin Nausea And Vomiting  . Meloxicam Hives    Current Outpatient Medications  Medication Sig Dispense Refill  . benazepril (LOTENSIN) 10 MG tablet TAKE 1 TABLET BY MOUTH EVERY DAY 30 tablet 11  . hydrochlorothiazide (HYDRODIURIL) 25 MG tablet Take 1/2-1  Tablet  needed 30 tablet 6  . hydroxychloroquine (PLAQUENIL) 200 MG tablet Take by mouth daily.    . metoprolol tartrate (LOPRESSOR) 50 MG tablet Take  1 tablet (50 mg total) by mouth 2 (two) times daily. 180 tablet 3  . NON FORMULARY CPAP therapy    . OVER THE COUNTER MEDICATION Take 1 capsule by mouth daily. Complete omega    . OVER THE COUNTER MEDICATION Take 1 tablet by mouth daily. Active Adult 50 plus    . rosuvastatin (CRESTOR) 20 MG tablet Take 1 tablet (20 mg total) by mouth daily. 90 tablet 3   No current facility-administered medications for this visit.     Social History   Socioeconomic History  . Marital status: Married    Spouse name: Not on file  . Number of children: Not on file  . Years of education: Not on file  . Highest education  level: Not on file  Social Needs  . Financial resource strain: Not on file  . Food insecurity - worry: Not on file  . Food insecurity - inability: Not on file  . Transportation needs - medical: Not on file  . Transportation needs - non-medical: Not on file  Occupational History  . Not on file  Tobacco Use  . Smoking status: Never Smoker  . Smokeless tobacco: Never Used  Substance and Sexual Activity  . Alcohol use: No    Alcohol/week: 0.0 oz  . Drug use: No  . Sexual activity: Not on file  Other Topics Concern  . Not on file  Social History Narrative  . Not on file   Social history is notable that she is married has one child one deceased. She does not routinely exercise. He denies alcohol use or tobacco.   Family History  Problem Relation Age of Onset  . Heart disease Mother   . Heart attack Mother   . Hypertension Mother   . Hyperlipidemia Mother   . Cancer - Other Mother        breast  . Heart attack Father   . Heart disease Father   . Hyperlipidemia Father   . Hypertension Father   . Cancer - Other Sister        thyroid  . Diabetes Brother   . Cancer - Other Sister        breast  . Cancer - Other Sister        breast;  had mastectomy  . Hypertension Sister    ROS General: Negative; No fevers, chills, or night sweats; Positive for morbid obesity HEENT: Negative; No changes in vision or hearing, sinus congestion, difficulty swallowing Pulmonary: Negative; No cough, wheezing, shortness of breath, hemoptysis Cardiovascular: See history of present illness Positive for ankle swelling, intermittently GI: Negative; No nausea, vomiting, diarrhea, or abdominal pain GU: Negative; No dysuria, hematuria, or difficulty voiding Musculoskeletal: Negative; no myalgias, joint pain, or weakness Rheumatologic c: Positive for systemic lupus erythematosus Hematologic/Oncology: Negative; no easy bruising, bleeding Endocrine: Negative; no heat/cold intolerance; no  diabetes Neuro: Negative; no changes in balance, headaches Skin: Negative; No rashes or skin lesions Psychiatric: Negative; No behavioral problems, depression Sleep: Positive for obstructive sleep apnea, now on CPAP with 100% compliance.  No snoring, daytime sleepiness, hypersomnolence, bruxism, restless legs, hypnogognic hallucinations, no cataplexy Other comprehensive 14 point system review is negative.   PE BP 132/69   Pulse 72   Ht 5' 1.5" (1.562 m)   Wt 261 lb (118.4 kg)   BMI 48.52 kg/m    Repeat blood pressure by me was 124/70  Wt Readings from Last 3 Encounters:  10/11/17 261 lb (118.4 kg)  08/22/17 261 lb (118.4 kg)  08/08/17 261 lb 3.2 oz (118.5 kg)   General: Alert, oriented, no distress.  Skin: normal turgor, no rashes, warm and dry HEENT: Normocephalic, atraumatic. Pupils equal round and reactive to light; sclera anicteric; extraocular muscles intact;  Nose without nasal septal hypertrophy Mouth/Parynx benign; Mallinpatti scale 4 Neck: No JVD, no carotid bruits; normal carotid upstroke Lungs: clear to ausculatation and percussion; no wheezing or rales Chest wall: without tenderness to palpitation Heart: PMI not displaced, RRR, s1 s2 normal, 1/6 systolic murmur, no diastolic murmur, no rubs, gallops, thrills, or heaves Abdomen: soft, nontender; no hepatosplenomehaly, BS+; abdominal aorta nontender and not dilated by palpation. Back: no CVA tenderness Pulses 2+ Musculoskeletal: full range of motion, normal strength, no joint deformities Extremities: no clubbing cyanosis or edema, Homan's sign negative  Neurologic: grossly nonfocal; Cranial nerves grossly wnl Psychologic: Normal mood and affect   ECG (independently read by me): Normal sinus rhythm at 72 bpm.  Right bundle branch block with repolarization changes.  October 2018 ECG (independently read by me): Normal sinus rhythm at 73 bpm.  Right bundle branch block with repolarization changes.  QTc interval 460  ms.  June 2017 ECG (independently read by me): Normal sinus rhythm at 65 bpm.  Right bundle-branch block with repolarization changes.  Small nondiagnostic inferior Q waves.  T-wave abnormality laterally.  October 2016 ECG (independently read by me, and (: Normal sinus rhythm at 71 beats per minute.  Right bundle branch block with repolarization changes.  Q wave in lead 3.  November 2014 ECG: Sinus rhythm with right bundle branch block. First degree AV block with a PR interval of 208 ms   LABS:  BMP Latest Ref Rng & Units 10/07/2017 09/17/2013 02/20/2011  Glucose 65 - 99 mg/dL 91 161(W) 960(A)  BUN 8 - 27 mg/dL 10 14 17   Creatinine 0.57 - 1.00 mg/dL 5.40 9.81 1.91  BUN/Creat Ratio 12 - 28 13 - -  Sodium 134 - 144 mmol/L 144 138 141  Potassium 3.5 - 5.2 mmol/L 4.5 4.0 3.9  Chloride 96 - 106 mmol/L 105 104 104  CO2 20 - 29 mmol/L 24 26 27   Calcium 8.7 - 10.3 mg/dL 9.6 9.5 9.6       Component Value Date/Time   PROT 8.1 10/07/2017 1400   ALBUMIN 4.3 10/07/2017 1400   AST 34 10/07/2017 1400   ALT 23 10/07/2017 1400   ALKPHOS 84 10/07/2017 1400   BILITOT 0.3 10/07/2017 1400     CBC Latest Ref Rng & Units 10/07/2017 09/17/2013 02/20/2011  WBC 3.4 - 10.8 x10E3/uL 5.8 5.2 4.2  Hemoglobin 11.1 - 15.9 g/dL 47.8 29.5 62.1  Hematocrit 34.0 - 46.6 % 41.7 43.2 45.3  Platelets 150 - 379 x10E3/uL 228 205 178       Component Value Date/Time   PROBNP 858.0 (H) 09/09/2007 0340       Component Value Date/Time   CHOL 148 10/07/2017 1400   TRIG 68 10/07/2017 1400   HDL 68 10/07/2017 1400   CHOLHDL 2.2 10/07/2017 1400   CHOLHDL 2.4 02/04/2015 0957   VLDL 20 02/04/2015 0957   LDLCALC 66 10/07/2017 1400     RADIOLOGY: No results found.  IMPRESSION:  1. CAD in native artery   2. S/P CABG (coronary artery bypass graft)   3. Essential hypertension, benign   4. Hyperlipidemia with target LDL less than 70   5. Morbid obesity (HCC)   6. RBBB     ASSESSMENT AND PLAN: [name] is a  66 year old female  who is 10 years status post CABG revascularization surgery in October 2008.  Her prior nuclear studies have shown mild inferior abnormality.  I reviewed her most recent nuclear perfusion study which again shows a probable small region of inferobasal scar without associated ischemia.  The study was low risk.  Global ejection fraction was 61%.  Her blood pressure today is stable on but has a pill 10 mg in addition to metoprolol tartrate 50 mg twice a day.  I reviewed her recent laboratory.  When I saw her last year, I changed her simvastatin to rosuvastatin.  LDL cholesterol is at target at 68 on current treatment.   She has a history of systemic lupus.  She continues to be on plaquenil.  She has obstructive sleep apnea which was moderate.  Overall, but very severe doing REM sleep.  She admits to 100% CPAP use.  She is unaware of any breakthrough snoring.  She denies residual daytime sleepiness.  Her sleep is restorative.  She is morbidly obese with a BMI of 48.52.  Weight loss and increased exercise was recommended.  Her ECG is stable with right bundle branch block.  As long as she remains stable, I will see her in one year for reevaluation.  Time spent: 25 minutes Lennette Bihari, MD, [facility]  10/13/2017 1:30 PM

## 2017-10-13 ENCOUNTER — Encounter: Payer: Self-pay | Admitting: Cardiovascular Disease

## 2017-12-18 DIAGNOSIS — Z6841 Body Mass Index (BMI) 40.0 and over, adult: Secondary | ICD-10-CM | POA: Diagnosis not present

## 2017-12-18 DIAGNOSIS — I2510 Atherosclerotic heart disease of native coronary artery without angina pectoris: Secondary | ICD-10-CM | POA: Diagnosis not present

## 2017-12-18 DIAGNOSIS — M329 Systemic lupus erythematosus, unspecified: Secondary | ICD-10-CM | POA: Diagnosis not present

## 2017-12-18 DIAGNOSIS — E7800 Pure hypercholesterolemia, unspecified: Secondary | ICD-10-CM | POA: Diagnosis not present

## 2017-12-20 DIAGNOSIS — L930 Discoid lupus erythematosus: Secondary | ICD-10-CM | POA: Diagnosis not present

## 2017-12-20 DIAGNOSIS — Z79899 Other long term (current) drug therapy: Secondary | ICD-10-CM | POA: Diagnosis not present

## 2018-02-20 DIAGNOSIS — Z6841 Body Mass Index (BMI) 40.0 and over, adult: Secondary | ICD-10-CM | POA: Diagnosis not present

## 2018-02-20 DIAGNOSIS — M329 Systemic lupus erythematosus, unspecified: Secondary | ICD-10-CM | POA: Diagnosis not present

## 2018-02-20 DIAGNOSIS — M170 Bilateral primary osteoarthritis of knee: Secondary | ICD-10-CM | POA: Diagnosis not present

## 2018-02-20 DIAGNOSIS — R899 Unspecified abnormal finding in specimens from other organs, systems and tissues: Secondary | ICD-10-CM | POA: Diagnosis not present

## 2018-02-20 DIAGNOSIS — M79604 Pain in right leg: Secondary | ICD-10-CM | POA: Diagnosis not present

## 2018-04-22 DIAGNOSIS — Z1211 Encounter for screening for malignant neoplasm of colon: Secondary | ICD-10-CM | POA: Diagnosis not present

## 2018-04-22 DIAGNOSIS — M329 Systemic lupus erythematosus, unspecified: Secondary | ICD-10-CM | POA: Diagnosis not present

## 2018-04-22 DIAGNOSIS — E7800 Pure hypercholesterolemia, unspecified: Secondary | ICD-10-CM | POA: Diagnosis not present

## 2018-04-22 DIAGNOSIS — I2510 Atherosclerotic heart disease of native coronary artery without angina pectoris: Secondary | ICD-10-CM | POA: Diagnosis not present

## 2018-04-22 DIAGNOSIS — Z0000 Encounter for general adult medical examination without abnormal findings: Secondary | ICD-10-CM | POA: Diagnosis not present

## 2018-04-22 DIAGNOSIS — Z1389 Encounter for screening for other disorder: Secondary | ICD-10-CM | POA: Diagnosis not present

## 2018-05-07 DIAGNOSIS — G4733 Obstructive sleep apnea (adult) (pediatric): Secondary | ICD-10-CM | POA: Diagnosis not present

## 2018-07-14 DIAGNOSIS — H40013 Open angle with borderline findings, low risk, bilateral: Secondary | ICD-10-CM | POA: Diagnosis not present

## 2018-07-14 DIAGNOSIS — H353131 Nonexudative age-related macular degeneration, bilateral, early dry stage: Secondary | ICD-10-CM | POA: Diagnosis not present

## 2018-07-14 DIAGNOSIS — Z79899 Other long term (current) drug therapy: Secondary | ICD-10-CM | POA: Diagnosis not present

## 2018-07-14 DIAGNOSIS — L930 Discoid lupus erythematosus: Secondary | ICD-10-CM | POA: Diagnosis not present

## 2018-07-30 ENCOUNTER — Other Ambulatory Visit: Payer: Self-pay | Admitting: Cardiovascular Disease

## 2018-08-06 DIAGNOSIS — H903 Sensorineural hearing loss, bilateral: Secondary | ICD-10-CM | POA: Diagnosis not present

## 2018-08-08 DIAGNOSIS — H903 Sensorineural hearing loss, bilateral: Secondary | ICD-10-CM | POA: Diagnosis not present

## 2018-08-11 ENCOUNTER — Other Ambulatory Visit: Payer: Self-pay | Admitting: Cardiovascular Disease

## 2018-08-22 DIAGNOSIS — M65331 Trigger finger, right middle finger: Secondary | ICD-10-CM | POA: Diagnosis not present

## 2018-08-22 DIAGNOSIS — M329 Systemic lupus erythematosus, unspecified: Secondary | ICD-10-CM | POA: Diagnosis not present

## 2018-08-22 DIAGNOSIS — R899 Unspecified abnormal finding in specimens from other organs, systems and tissues: Secondary | ICD-10-CM | POA: Diagnosis not present

## 2018-08-22 DIAGNOSIS — M170 Bilateral primary osteoarthritis of knee: Secondary | ICD-10-CM | POA: Diagnosis not present

## 2018-08-22 DIAGNOSIS — Z6841 Body Mass Index (BMI) 40.0 and over, adult: Secondary | ICD-10-CM | POA: Diagnosis not present

## 2018-08-22 DIAGNOSIS — M79604 Pain in right leg: Secondary | ICD-10-CM | POA: Diagnosis not present

## 2018-12-20 ENCOUNTER — Other Ambulatory Visit: Payer: Self-pay | Admitting: Cardiovascular Disease

## 2019-01-04 ENCOUNTER — Other Ambulatory Visit: Payer: Self-pay | Admitting: Cardiovascular Disease

## 2019-01-05 ENCOUNTER — Other Ambulatory Visit: Payer: Self-pay | Admitting: Cardiovascular Disease

## 2019-01-27 ENCOUNTER — Other Ambulatory Visit: Payer: Self-pay | Admitting: *Deleted

## 2019-01-28 MED ORDER — ROSUVASTATIN CALCIUM 20 MG PO TABS: ORAL_TABLET | ORAL | 0 refills | Status: DC

## 2019-01-31 ENCOUNTER — Other Ambulatory Visit: Payer: Self-pay | Admitting: Cardiovascular Disease

## 2019-02-11 ENCOUNTER — Telehealth: Payer: Self-pay | Admitting: Cardiovascular Disease

## 2019-02-11 NOTE — Telephone Encounter (Signed)
   Primary Cardiologist:  No primary care provider on file.   Patient contacted.  History reviewed.  No symptoms to suggest any unstable cardiac conditions.  Based on discussion, with current pandemic situation, we will be postponing this appointment for [name] with a plan for f/u in 6-12 wks or sooner if feasible/necessary.  If symptoms change, she has been instructed to contact our office.   Routing to C19 CANCEL pool for tracking (P CV DIV CV19 CANCEL - reason for visit "other.") and assigning priority (1 = 4-6 wks, 2 = 6-12 wks, 3 = >12 wks).   Evans Lance  02/11/2019 9:57 AM         .

## 2019-02-17 ENCOUNTER — Ambulatory Visit: Payer: PPO | Admitting: Cardiovascular Disease

## 2019-02-19 ENCOUNTER — Other Ambulatory Visit: Payer: Self-pay | Admitting: Cardiovascular Disease

## 2019-02-19 NOTE — Telephone Encounter (Signed)
Rosuvastatin refilled.

## 2019-02-23 DIAGNOSIS — M79604 Pain in right leg: Secondary | ICD-10-CM | POA: Diagnosis not present

## 2019-02-23 DIAGNOSIS — M65331 Trigger finger, right middle finger: Secondary | ICD-10-CM | POA: Diagnosis not present

## 2019-02-23 DIAGNOSIS — M170 Bilateral primary osteoarthritis of knee: Secondary | ICD-10-CM | POA: Diagnosis not present

## 2019-02-23 DIAGNOSIS — R899 Unspecified abnormal finding in specimens from other organs, systems and tissues: Secondary | ICD-10-CM | POA: Diagnosis not present

## 2019-02-23 DIAGNOSIS — M329 Systemic lupus erythematosus, unspecified: Secondary | ICD-10-CM | POA: Diagnosis not present

## 2019-02-25 ENCOUNTER — Telehealth: Payer: Self-pay | Admitting: Cardiovascular Disease

## 2019-02-25 NOTE — Telephone Encounter (Signed)
LVM for patient to call and reschedule 12 month virtual visit.

## 2019-03-03 ENCOUNTER — Other Ambulatory Visit: Payer: Self-pay | Admitting: Cardiovascular Disease

## 2019-04-28 ENCOUNTER — Other Ambulatory Visit: Payer: Self-pay | Admitting: *Deleted

## 2019-04-28 DIAGNOSIS — Z20822 Contact with and (suspected) exposure to covid-19: Secondary | ICD-10-CM

## 2019-05-03 LAB — NOVEL CORONAVIRUS, NAA: SARS-CoV-2, NAA: NOT DETECTED

## 2019-05-13 DIAGNOSIS — M329 Systemic lupus erythematosus, unspecified: Secondary | ICD-10-CM | POA: Diagnosis not present

## 2019-05-13 DIAGNOSIS — I2510 Atherosclerotic heart disease of native coronary artery without angina pectoris: Secondary | ICD-10-CM | POA: Diagnosis not present

## 2019-05-13 DIAGNOSIS — Z1389 Encounter for screening for other disorder: Secondary | ICD-10-CM | POA: Diagnosis not present

## 2019-05-13 DIAGNOSIS — I10 Essential (primary) hypertension: Secondary | ICD-10-CM | POA: Diagnosis not present

## 2019-05-13 DIAGNOSIS — E669 Obesity, unspecified: Secondary | ICD-10-CM | POA: Diagnosis not present

## 2019-05-13 DIAGNOSIS — Z0000 Encounter for general adult medical examination without abnormal findings: Secondary | ICD-10-CM | POA: Diagnosis not present

## 2019-05-13 DIAGNOSIS — E7800 Pure hypercholesterolemia, unspecified: Secondary | ICD-10-CM | POA: Diagnosis not present

## 2019-05-19 ENCOUNTER — Telehealth: Payer: Self-pay

## 2019-05-19 NOTE — Telephone Encounter (Signed)
Called patient, advised of virtual visit for Thursday 07/16 with Dr.Kelly. Patient is aware, will try video visit to cell number [phone]. Aware to have vitals ready and medications prepared.

## 2019-05-21 ENCOUNTER — Telehealth: Payer: Self-pay | Admitting: Cardiovascular Disease

## 2019-05-21 ENCOUNTER — Telehealth (INDEPENDENT_AMBULATORY_CARE_PROVIDER_SITE_OTHER): Payer: PPO | Admitting: Cardiovascular Disease

## 2019-05-21 VITALS — Ht 61.5 in | Wt 255.0 lb

## 2019-05-21 DIAGNOSIS — Z951 Presence of aortocoronary bypass graft: Secondary | ICD-10-CM

## 2019-05-21 DIAGNOSIS — I2510 Atherosclerotic heart disease of native coronary artery without angina pectoris: Secondary | ICD-10-CM

## 2019-05-21 DIAGNOSIS — I4510 Unspecified right bundle-branch block: Secondary | ICD-10-CM

## 2019-05-21 DIAGNOSIS — I10 Essential (primary) hypertension: Secondary | ICD-10-CM | POA: Diagnosis not present

## 2019-05-21 DIAGNOSIS — E785 Hyperlipidemia, unspecified: Secondary | ICD-10-CM | POA: Diagnosis not present

## 2019-05-21 DIAGNOSIS — G4733 Obstructive sleep apnea (adult) (pediatric): Secondary | ICD-10-CM | POA: Diagnosis not present

## 2019-05-21 NOTE — Progress Notes (Signed)
Virtual Visit via Video Note   This visit type was conducted due to national recommendations for restrictions regarding the COVID-19 Pandemic (e.g. social distancing) in an effort to limit this patient's exposure and mitigate transmission in our community.  Due to her co-morbid illnesses, this patient is at least at moderate risk for complications without adequate follow up.  This format is felt to be most appropriate for this patient at this time.  All issues noted in this document were discussed and addressed.  A limited physical exam was performed with this format.  Please refer to the patient's chart for her consent to telehealth for [facility].   Date:  05/21/2019   ID:  [name], [name], [name], MRN [id number]  Patient Location: Home Provider Location: Home  PCP:  Carol Ada, MD  Cardiologist:  Shelva Majestic, MD Electrophysiologist:  None   Evaluation Performed:  Follow-Up Visit  Chief Complaint: 22-month follow-up evaluation  History of Present Illness:    [name] is a 68 y.o. female who has a history of coronary artery disease in October 2008 underwent CABG surgery by Dr. Cyndia Bent with a LIMA to the LAD, vein to the obtuse marginal, vein to the RCA. Her last nuclear perfusion study in October 2013 was unchanged and showed mild inferior abnormality.  She has a history of moderate obstructive sleep apnea which is severe during REM sleep. She was initially started on CPAP therapy 2010 but when I saw her in April 2014 she had not been using therapy for over the past year. At that time, I strongly advise reinstitution of CPAP therapy.  She was using[employer] as her DME company.  She recently received a new mask, which is a full face mask.  She admits to 100% compliance.  She is unaware of breakthrough snoring.  She denies excessive daytime sleepiness.  Additional problems include hyperlipidemia, right bundle branch block, systemic lupus erythematosus, as well as  obesity.  She sees Dr. Amil Amen for her lupus.  When I  saw her in October 2017, she had experienced an episode of nonexertional chest tightness which lasted for several minutes.   She had not been very active due to bilateral deep discomfort. She had not been very active due to bilateral knee pain.  In June 2017 an echo Doppler study showed an EF of 60-65% with grade 2 diastolic dysfunction.  She has continued to use CPAP with 100% compliance.  She has been on aspirin.  benazapril 10 mg, HCTZ as needed, and metoprolol 50 mg twice a day.  When I saw her in 2017 she was changed from simvastatin to rosuvastatin for more aggressive lipid intervention.  She continues to be on rosuvastatin 20 mg for hyperlipidemia.  She underwent a nuclear perfusion study for a 10 year follow-up evaluation in October 2018.  This remained low risk and showed a small inferobasilar defect, without associated ischemia and with akinesis of this info basal segment.  Ejection fraction was 61%.  She underwent laboratory on 10/07/2017 which showed a total cluster 148, HDL 68, LDL 66, and triglycerides 68.    Since I last saw her in December 2018, she has remained stable from a cardiac standpoint.  Unfortunately, her CPAP therapy stopped working last year and as result she has not been using treatment.  She has not been sleeping as well off CPAP therapy.  She denies any recurrent anginal symptoms or shortness of breath.  She is scheduled to undergo laboratory with her primary physician  Dr. Katrinka BlazingSmith on July 28.  She has issues with bilateral knee discomfort and has difficulty walking.  She continues to be followed by Dr. Dierdre ForthBeekman for her lupus.  She presents for follow-up evaluation.    The patient does not have symptoms concerning for COVID-19 infection (fever, chills, cough, or new shortness of breath).    Past Medical History:  Diagnosis Date  . CAD (coronary artery disease)   . Hyperlipidemia   . Hypertension   . Obesity   . OSA  (obstructive sleep apnea)   . RBBB   . Sleep apnea   . Systemic lupus erythematosus (HCC)    Past Surgical History:  Procedure Laterality Date  . CARDIAC CATHETERIZATION  09/02/2007  . CORONARY ANGIOPLASTY    . CORONARY ARTERY BYPASS GRAFT  09/02/2007   LIMA to the LAD,SVG to obtuse marginal branch of the left CX,SVG to the RCA     Current Meds  Medication Sig  . benazepril (LOTENSIN) 10 MG tablet TAKE 1 TABLET BY MOUTH EVERY DAY (Patient taking differently: Take 5 mg by mouth daily. )  . Cyanocobalamin (VITAMIN B12 PO) Take 1 tablet by mouth daily.  . hydroxychloroquine (PLAQUENIL) 200 MG tablet Take by mouth daily.  . metoprolol tartrate (LOPRESSOR) 50 MG tablet Take 1 tablet (50 mg total) by mouth 2 (two) times daily. Pt must keep upcoming appt in July to get more refills  . NON FORMULARY CPAP therapy  . OVER THE COUNTER MEDICATION Take 1 capsule by mouth daily. Complete omega  . OVER THE COUNTER MEDICATION Take 1 tablet by mouth daily. Active Adult 50 plus  . rosuvastatin (CRESTOR) 20 MG tablet TAKE 1 TABLET BY MOUTH DAILY. NEED OFFICE VISIT     Allergies:   Aspirin and Meloxicam   Social History   Tobacco Use  . Smoking status: Never Smoker  . Smokeless tobacco: Never Used  Substance Use Topics  . Alcohol use: No    Alcohol/week: 0.0 standard drinks  . Drug use: No     Family Hx: The patient's family history includes Cancer - Other in her mother, sister, sister, and sister; Diabetes in her brother; Heart attack in her father and mother; Heart disease in her father and mother; Hyperlipidemia in her father and mother; Hypertension in her father, mother, and sister.  ROS:   Please see the history of present illness.  Mild weight loss She denies any fevers chills or night sweats. She recently had a COVID test which was negative.  This was done routinely at her church She denies wheezing or cough No chest pain PND orthopnea or palpitations Positive for systemic lupus  erythematosus No easy bruisability Bilateral knee discomfort Off CPAP with snoring, sleep is not as restorative as it had been on CPAP therapy; no bruxism All other systems reviewed and are negative.   Prior CV studies:   The following studies were reviewed today:  Evaluations from Dr. Merri Brunetteandace Smith and Dr.James Dierdre ForthBeekman,  Labs/Other Tests and Data Reviewed:    EKG:  An ECG dated October 11, 2017 was personally reviewed today and demonstrated:  Normal sinus rhythm at 72 bpm.  Right bundle branch block with repolarization changes  Recent Labs: No results found for requested labs within last 8760 hours.   Recent Lipid Panel Lab Results  Component Value Date/Time   CHOL 148 10/07/2017 02:00 PM   TRIG 68 10/07/2017 02:00 PM   HDL 68 10/07/2017 02:00 PM   CHOLHDL 2.2 10/07/2017 02:00 PM   CHOLHDL  2.4 02/04/2015 09:57 AM   LDLCALC 66 10/07/2017 02:00 PM    Wt Readings from Last 3 Encounters:  05/21/19 255 lb (115.7 kg)  10/11/17 261 lb (118.4 kg)  08/22/17 261 lb (118.4 kg)     Objective:    Vital Signs:  Ht 5' 1.5" (1.562 m)   Wt 255 lb (115.7 kg)   BMI 47.40 kg/m    Morbid obese with BMI of 47.40.   HEENT appeared unremarkable Thick neck Breathing is normal and unlabored No audible wheezing No chest wall discomfort. Palpated rhythm was stable Nontender abdomen Knee discomfort No awareness of neurologic symptoms Normal affect and mood   ASSESSMENT & PLAN:    1. CAD/status post CABG revascularization: Surgery performed in October 2008 by Dr. Laneta SimmersBartle.  Subsequent nuclear study shows small region of inferobasal scar without associated ischemia; low risk 2. Essential hypertension: She does not have a blood pressure cuff to monitor blood pressure today.  However recently she states her blood pressure was 132/78 on benazepril 10 mg and Toprol tartrate 50 mg twice a day. 3. Obstructive sleep apnea: Her CPAP machine stopped functioning last year.  Her initial sleep study  in 2010 revealed moderately severe overall sleep apnea with severe sleep apnea during REM sleep.  We will plan to schedule patient for a new split-night protocol to reassess her sleep apnea with CPAP titration and then plan to prescribe a new CPAP machine. 4. Hyperlipidemia: Currently on rosuvastatin.  Laboratory to be checked next week by Dr. Merri Brunetteandace Smith.  Target LDL less than 70 5. Morbid obesity: Weight loss and increased exercise recommended.  COVID-19 Education: The signs and symptoms of COVID-19 were discussed with the patient and how to seek care for testing (follow up with PCP or arrange E-visit).  The importance of social distancing was discussed today.  Time:   Today, I have spent 25 minutes with the patient with telehealth technology discussing the above problems.     Medication Adjustments/Labs and Tests Ordered: Current medicines are reviewed at length with the patient today.  Concerns regarding medicines are outlined above.   Tests Ordered: No orders of the defined types were placed in this encounter.   Medication Changes: No orders of the defined types were placed in this encounter.   Follow Up: Within 3 months of her getting her new CPAP machine for compliance assessment.  Signed, Nicki Guadalajarahomas Kelly, MD  05/21/2019 3:39 PM    [facility]

## 2019-05-21 NOTE — Telephone Encounter (Signed)
LVM, reminding ot of her  With Dr  Claiborne Billings on 05-21-19. I also mmessage for pt to call so we can get verbal consent for visit.

## 2019-05-21 NOTE — Telephone Encounter (Signed)
Pt gave her verbal consent.       1. Confirm consent - "In the setting of the current Covid19 crisis, you are scheduled for a (phone or video) visit with your provider on (date) at (time).  Just as we do with many in-office visits, in order for you to participate in this visit, we must obtain consent.  If you'd like, I can send this to your mychart (if signed up) or email for you to review.  Otherwise, I can obtain your verbal consent now.  All virtual visits are billed to your insurance company just like a normal visit would be.  By agreeing to a virtual visit, we'd like you to understand that the technology does not allow for your provider to perform an examination, and thus may limit your provider's ability to fully assess your condition. If your provider identifies any concerns that need to be evaluated in person, we will make arrangements to do so.  Finally, though the technology is pretty good, we cannot assure that it will always work on either your or our end, and in the setting of a video visit, we may have to convert it to a phone-only visit.  In either situation, we cannot ensure that we have a secure connection.  Are you willing to proceed?" STAFF: Did the patient verbally acknowledge consent to telehealth visit? Document YES/NO here: Yes       FULL LENGTH CONSENT FOR TELE-HEALTH VISIT   I hereby voluntarily request, consent and authorize [facility] and its employed or contracted physicians, physician assistants, nurse practitioners or other licensed health care professionals (the Practitioner), to provide me with telemedicine health care services (the Services") as deemed necessary by the treating Practitioner. I acknowledge and consent to receive the Services by the Practitioner via telemedicine. I understand that the telemedicine visit will involve communicating with the Practitioner through live audiovisual communication technology and the disclosure of certain medical  information by electronic transmission. I acknowledge that I have been given the opportunity to request an in-person assessment or other available alternative prior to the telemedicine visit and am voluntarily participating in the telemedicine visit.  I understand that I have the right to withhold or withdraw my consent to the use of telemedicine in the course of my care at any time, without affecting my right to future care or treatment, and that the Practitioner or I may terminate the telemedicine visit at any time. I understand that I have the right to inspect all information obtained and/or recorded in the course of the telemedicine visit and may receive copies of available information for a reasonable fee.  I understand that some of the potential risks of receiving the Services via telemedicine include:   Delay or interruption in medical evaluation due to technological equipment failure or disruption;  Information transmitted may not be sufficient (e.g. poor resolution of images) to allow for appropriate medical decision making by the Practitioner; and/or   In rare instances, security protocols could fail, causing a breach of personal health information.  Furthermore, I acknowledge that it is my responsibility to provide information about my medical history, conditions and care that is complete and accurate to the best of my ability. I acknowledge that Practitioner's advice, recommendations, and/or decision may be based on factors not within their control, such as incomplete or inaccurate data provided by me or distortions of diagnostic images or specimens that may result from electronic transmissions. I understand that the practice of medicine is not an exact  science and that Practitioner makes no warranties or guarantees regarding treatment outcomes. I acknowledge that I will receive a copy of this consent concurrently upon execution via email to the email address I last provided but may also  request a printed copy by calling the office of [facility].    I understand that my insurance will be billed for this visit.   I have read or had this consent read to me.  I understand the contents of this consent, which adequately explains the benefits and risks of the Services being provided via telemedicine.   I have been provided ample opportunity to ask questions regarding this consent and the Services and have had my questions answered to my satisfaction.  I give my informed consent for the services to be provided through the use of telemedicine in my medical care  By participating in this telemedicine visit I agree to the above.

## 2019-05-21 NOTE — Patient Instructions (Addendum)
Medication Instructions:  The current medical regimen is effective;  continue present plan and medications.  If you need a refill on your cardiac medications before your next appointment, please call your pharmacy.   Follow-Up: At [facility], you and your health needs are our priority.  As part of our continuing mission to provide you with exceptional heart care, we have created designated Provider Care Teams.  These Care Teams include your primary Cardiologist (physician) and Advanced Practice Providers (APPs -  Physician Assistants and Nurse Practitioners) who all work together to provide you with the care you need, when you need it. You will need a follow up appointment in 3 months.  Please call our office 2 months in advance to schedule this appointment.  You may see Dr.Kelly or one of the following Advanced Practice Providers on your designated Care Team: Almyra Deforest, [place] . Fabian Sharp, PA-C

## 2019-05-22 NOTE — Addendum Note (Signed)
Addended by: Caprice Beaver T on: 05/22/2019 08:20 AM   Modules accepted: Orders

## 2019-05-30 ENCOUNTER — Other Ambulatory Visit: Payer: Self-pay | Admitting: Cardiovascular Disease

## 2019-06-02 DIAGNOSIS — I10 Essential (primary) hypertension: Secondary | ICD-10-CM | POA: Diagnosis not present

## 2019-06-02 DIAGNOSIS — E7800 Pure hypercholesterolemia, unspecified: Secondary | ICD-10-CM | POA: Diagnosis not present

## 2019-07-04 ENCOUNTER — Other Ambulatory Visit: Payer: Self-pay | Admitting: Cardiovascular Disease

## 2019-08-06 ENCOUNTER — Ambulatory Visit: Payer: PPO | Admitting: Cardiovascular Disease

## 2019-08-17 ENCOUNTER — Other Ambulatory Visit: Payer: Self-pay | Admitting: Cardiovascular Disease

## 2019-08-17 NOTE — Telephone Encounter (Signed)
°*  STAT* If patient is at the pharmacy, call can be transferred to refill team.   1. Which medications need to be refilled? (please list name of each medication and dose if known)  New prescriptions for Benazepril and Metoprolol  2. Which pharmacy/location (including street and city if local pharmacy) is medication to be sent to? [facility]  3. Do they need a 30 day or 90 day supply? 90 days and refills

## 2019-08-18 MED ORDER — METOPROLOL TARTRATE 50 MG PO TABS: ORAL_TABLET | ORAL | 0 refills | Status: DC

## 2019-08-18 NOTE — Telephone Encounter (Signed)
Refill sent needs appt for additional refills

## 2019-08-24 ENCOUNTER — Other Ambulatory Visit: Payer: Self-pay | Admitting: Cardiovascular Disease

## 2019-08-24 MED ORDER — BENAZEPRIL HCL 10 MG PO TABS: 10.0000 mg | ORAL_TABLET | Freq: Every day | ORAL | 10 refills | Status: DC

## 2019-08-24 NOTE — Telephone Encounter (Signed)
Rx(s) sent to pharmacy electronically.  Per MD note, patient should be on benazepril 10mg  QD

## 2019-08-24 NOTE — Telephone Encounter (Signed)
New Message     *STAT* If patient is at the pharmacy, call can be transferred to refill team.   1. Which medications need to be refilled? (please list name of each medication and dose if known) Benazepril 10mg   2. Which pharmacy/location (including street and city if local pharmacy) is medication to be sent to? [facility] on [address]  3. Do they need a 30 day or 90 day supply? 30 day supply

## 2019-08-25 DIAGNOSIS — Z6841 Body Mass Index (BMI) 40.0 and over, adult: Secondary | ICD-10-CM | POA: Diagnosis not present

## 2019-08-25 DIAGNOSIS — M79604 Pain in right leg: Secondary | ICD-10-CM | POA: Diagnosis not present

## 2019-08-25 DIAGNOSIS — R899 Unspecified abnormal finding in specimens from other organs, systems and tissues: Secondary | ICD-10-CM | POA: Diagnosis not present

## 2019-08-25 DIAGNOSIS — M170 Bilateral primary osteoarthritis of knee: Secondary | ICD-10-CM | POA: Diagnosis not present

## 2019-08-25 DIAGNOSIS — M65331 Trigger finger, right middle finger: Secondary | ICD-10-CM | POA: Diagnosis not present

## 2019-08-25 DIAGNOSIS — M329 Systemic lupus erythematosus, unspecified: Secondary | ICD-10-CM | POA: Diagnosis not present

## 2019-10-06 ENCOUNTER — Other Ambulatory Visit: Payer: Self-pay | Admitting: Family Medicine

## 2019-10-06 DIAGNOSIS — Z1231 Encounter for screening mammogram for malignant neoplasm of breast: Secondary | ICD-10-CM

## 2019-10-15 ENCOUNTER — Ambulatory Visit: Payer: PPO | Admitting: Cardiovascular Disease

## 2019-10-15 ENCOUNTER — Encounter: Payer: Self-pay | Admitting: Cardiovascular Disease

## 2019-10-15 ENCOUNTER — Other Ambulatory Visit: Payer: Self-pay

## 2019-10-15 DIAGNOSIS — I10 Essential (primary) hypertension: Secondary | ICD-10-CM

## 2019-10-15 DIAGNOSIS — I4510 Unspecified right bundle-branch block: Secondary | ICD-10-CM

## 2019-10-15 DIAGNOSIS — Z951 Presence of aortocoronary bypass graft: Secondary | ICD-10-CM

## 2019-10-15 DIAGNOSIS — G4733 Obstructive sleep apnea (adult) (pediatric): Secondary | ICD-10-CM | POA: Diagnosis not present

## 2019-10-15 DIAGNOSIS — I2510 Atherosclerotic heart disease of native coronary artery without angina pectoris: Secondary | ICD-10-CM

## 2019-10-15 DIAGNOSIS — E785 Hyperlipidemia, unspecified: Secondary | ICD-10-CM

## 2019-10-15 NOTE — Patient Instructions (Addendum)
Medication Instructions:  Your physician recommends that you continue on your current medications as directed. Please refer to the Current Medication list given to you today.  *If you need a refill on your cardiac medications before your next appointment, please call your pharmacy*  Lab Work: COVID SCREENING 3 Kyle   If you have labs (blood work) drawn today and your tests are completely normal, you will receive your results only by: [name] Kitchen MyChart Message (if you have MyChart) OR . A paper copy in the mail If you have any lab test that is abnormal or we need to change your treatment, we will call you to review the results.  Testing/Procedures: Your physician has recommended that you have a sleep study. This test records several body functions during sleep, including: brain activity, eye movement, oxygen and carbon dioxide blood levels, heart rate and rhythm, breathing rate and rhythm, the flow of air through your mouth and nose, snoring, body muscle movements, and chest and belly movement. THE OFFICE WILL CALL YOU TO SCHEDULE ONCE INSURANCE HAS APPROVED   Follow-Up: At [facility], you and your health needs are our priority.  As part of our continuing mission to provide you with exceptional heart care, we have created designated Provider Care Teams.  These Care Teams include your primary Cardiologist (physician) and Advanced Practice Providers (APPs -  Physician Assistants and Nurse Practitioners) who all work together to provide you with the care you need, when you need it.  Your next appointment:   4 month(s) OV/SLEEP You will receive a reminder letter in the mail two months in advance. If you don't receive a letter, please call our office to schedule the follow-up appointment.   The format for your next appointment:   In Person  Provider:   You may see DR Claiborne Billings  or one of the following Advanced Practice Providers on your designated Care  Team:    Almyra Deforest, PA-C  Fabian Sharp, [place] or   Roby Lofts, [place]   Other Instructions   Sleep Studies A sleep study (polysomnogram) is a series of tests done while you are sleeping. A sleep study records your brain waves, heart rate, breathing rate, oxygen level, and eye and leg movements. A sleep study helps your health care provider:  See how well you sleep.  Diagnose a sleep disorder.  Determine how severe your sleep disorder is.  Create a plan to treat your sleep disorder. Your health care provider may recommend a sleep study if you:  Feel sleepy on most days.  Snore loudly while sleeping.  Have unusual behaviors while you sleep, such as walking.  Have brief periods in which you stop breathing during sleep (sleepapnea).  Fall asleep suddenly during the day (narcolepsy).  Have trouble falling asleep or staying asleep (insomnia).  Feel like you need to move your legs when trying to fall asleep (restless legs syndrome).  Move your legs by flexing and extending them regularly while asleep (periodic limb movement disorder).  Act out your dreams while you sleep (sleep behavior disorder).  Feel like you cannot move when you first wake up (sleep paralysis). What tests are part of a sleep study? Most sleep studies record the following during sleep:  Brain activity.  Eye movements.  Heart rate and rhythm.  Breathing rate and rhythm.  Blood-oxygen level.  Blood pressure.  Chest and belly movement as you breathe.  Arm and leg movements.  Snoring or other  noises.  Body position. Where are sleep studies done? Sleep studies are done at sleep centers. A sleep center may be inside a hospital, office, or clinic. The room where you have the study may look like a hospital room or a hotel room. The health care providers doing the study may come in and out of the room during the study. Most of the time, they will be in another room monitoring your test as you  sleep. How are sleep studies done? Most sleep studies are done during a normal period of time for a full night of sleep. You will arrive at the study center in the evening and go home in the morning. Before the test  Bring your pajamas and toothbrush with you to the sleep study.  Do not have caffeine on the day of your sleep study.  Do not drink alcohol on the day of your sleep study.  Your health care provider will let you know if you should stop taking any of your regular medicines before the test. During the test      Round, sticky patches with sensors attached to recording wires (electrodes) are placed on your scalp, face, chest, and limbs.  Wires from all the electrodes and sensors run from your bed to a computer. The wires can be taken off and put back on if you need to get out of bed to go to the bathroom.  A sensor is placed over your nose to measure airflow.  A finger clip is put on your finger or ear to measure your blood oxygen level (pulse oximetry).  A belt is placed around your belly and a belt is placed around your chest to measure breathing movements.  If you have signs of the sleep disorder called sleep apnea during your test, you may get a treatment mask to wear for the second half of the night. ? The mask provides positive airway pressure (PAP) to help you breathe better during sleep. This may greatly improve your sleep apnea. ? You will then have all tests done again with the mask in place to see if your measurements and recordings change. After the test  A medical doctor who specializes in sleep will evaluate the results of your sleep study and share them with you and your primary health care provider.  Based on your results, your medical history, and a physical exam, you may be diagnosed with a sleep disorder, such as: ? Sleep apnea. ? Restless legs syndrome. ? Sleep-related behavior disorder. ? Sleep-related movement disorders. ? Sleep-related seizure  disorders.  Your health care team will help determine your treatment options based on your diagnosis. This may include: ? Improving your sleep habits (sleep hygiene). ? Wearing a continuous positive airway pressure (CPAP) or bi-level positive airway pressure (BPAP) mask. ? Wearing an oral device at night to improve breathing and reduce snoring. ? Taking medicines. Follow these instructions at home:  Take over-the-counter and prescription medicines only as told by your health care provider.  If you are instructed to use a CPAP or BPAP mask, make sure you use it nightly as directed.  Make any lifestyle changes that your health care provider recommends.  If you were given a device to open your airway while you sleep, use it only as told by your health care provider.  Do not use any tobacco products, such as cigarettes, chewing tobacco, and e-cigarettes. If you need help quitting, ask your health care provider.  Keep all follow-up visits as told  by your health care provider. This is important. Summary  A sleep study (polysomnogram) is a series of tests done while you are sleeping. It shows how well you sleep.  Most sleep studies are done over one full night of sleep. You will arrive at the study center in the evening and go home in the morning.  If you have signs of the sleep disorder called sleep apnea during your test, you may get a treatment mask to wear for the second half of the night.  A medical doctor who specializes in sleep will evaluate the results of your sleep study and share them with your primary health care provider. This information is not intended to replace advice given to you by your health care provider. Make sure you discuss any questions you have with your health care provider. Document Released: 04/28/2003 Document Revised: 08/08/2018 Document Reviewed: 11/19/2017 [employer] Patient Education  2020 [employer]

## 2019-10-15 NOTE — Progress Notes (Signed)
Patient ID: ROSALIE BUENAVENTURA, female   DOB: 1951/04/23, 68 y.o.   MRN: [id number]      Primary M.D.: Dr. Merri Brunette   HPI: SHEREDA GRAW is a 68 y.o. female who presents to the office for a 5 month follow-up cardiology evaluation.  Ms. Dewilde has a history of coronary artery disease in October 2008 underwent CABG surgery by Dr. Laneta Simmers with a LIMA to the LAD, vein to the obtuse marginal, vein to the RCA. Her last nuclear perfusion study in October 2013 was unchanged and showed mild inferior abnormality.  She has a history of moderate obstructive sleep apnea which is severe during REM sleep. She was initially started on CPAP therapy 2010 but when I  saw her in April 2014 she had not been using therapy for over the past year. At that time, I strongly advise reinstitution of CPAP therapy.  She now uses [employer] as her DME company.  She recently received a new mask, which is a full face mask.  She admits to 100% compliance.  She is unaware of breakthrough snoring.  She denies excessive daytime sleepiness.  Additional problems include hyperlipidemia, right bundle branch block, systemic lupus erythematosus, as well as obesity.  She sees Dr. Dierdre Forth for her lupus.  When I saw her in October 2017, she had experienced an episode of nonexertional chest tightness which lasted for several minutes.   She had not been very active due to bilateral deep discomfort. She had not been very active due to bilateral knee pain.  In June 2017 an echo Doppler study showed an EF of 60-65% with grade 2 diastolic dysfunction.  Over the past year, she has continued to use CPAP with 100% compliance.  She has been on aspirin.  benazapril 10 mg, HCTZ as needed, and metoprolol 50 mg twice a day.  When I saw her last year, she was changed from simvastatin to rosuvastatin for more aggressive lipid intervention.  She continues to be on rosuvastatin 20 mg for hyperlipidemia.  She underwent a nuclear perfusion study for a 10 year  follow-up evaluation in October 2018.  This remained low risk and showed a small inferobasilar defect, without associated ischemia and with akinesis of this info basal segment.  Ejection fraction was 61%.  She underwent laboratory on 10/07/2017 which showed a total cluster 148, HDL 68, LDL 66, and triglycerides 68.    In July 2020, she was evaluated in a 11-month follow-up telemedicine visit.  Since I previously seen her she had remained stable from a cardiac standpoint.  Unfortunately, her CPAP therapy stopped working last year and has not been on therapy.  She had not been sleeping as well.  She denied recurrent anginal symptomatology or shortness of breath.  She was having difficulty with bilateral knee discomfort and therefore was not able to walk well.  She continues to be followed by Dr. Dierdre Forth for lupus.  Since she had her initial safety 2010 which led to severe sleep apnea with severe sleep apnea during REM sleep I recommended the patient to undergo a split night protocol to reassess her OSA with plan to prescribe a new CPAP machine.  Apparently, due to the COVID-19 pandemic she did not have her follow-up evaluation.  She has continued to use stationary bike for exercise.  She has been caring for her husband with dementia.  She now presents for follow-up evaluation.  Past Medical History:  Diagnosis Date   CAD (coronary artery disease)    Hyperlipidemia  Hypertension    Obesity    OSA (obstructive sleep apnea)    RBBB    Sleep apnea    Systemic lupus erythematosus (HCC)     Past Surgical History:  Procedure Laterality Date   CARDIAC CATHETERIZATION  09/02/2007   CORONARY ANGIOPLASTY     CORONARY ARTERY BYPASS GRAFT  09/02/2007   LIMA to the LAD,SVG to obtuse marginal branch of the left CX,SVG to the RCA    Allergies  Allergen Reactions   Aspirin Nausea And Vomiting   Meloxicam Hives    Current Outpatient Medications  Medication Sig Dispense Refill    benazepril (LOTENSIN) 10 MG tablet Take 1 tablet (10 mg total) by mouth daily. 30 tablet 10   Cyanocobalamin (VITAMIN B12 PO) Take 1 tablet by mouth daily.     hydroxychloroquine (PLAQUENIL) 200 MG tablet Take by mouth daily.     metoprolol tartrate (LOPRESSOR) 50 MG tablet TAKE 1 TABLET (50 MG TOTAL) BY MOUTH 2 (TWO) TIMES DAILY. PT MUST KEEP UPCOMING APPT 180 tablet 0   NON FORMULARY CPAP therapy     OVER THE COUNTER MEDICATION Take 1 capsule by mouth daily. Complete omega     OVER THE COUNTER MEDICATION Take 1 tablet by mouth daily. Active Adult 50 plus     rosuvastatin (CRESTOR) 20 MG tablet TAKE 1 TABLET BY MOUTH DAILY. 90 tablet 3   No current facility-administered medications for this visit.    Social History   Socioeconomic History   Marital status: Married    Spouse name: Not on file   Number of children: Not on file   Years of education: Not on file   Highest education level: Not on file  Occupational History   Not on file  Tobacco Use   Smoking status: Never Smoker   Smokeless tobacco: Never Used  Substance and Sexual Activity   Alcohol use: No    Alcohol/week: 0.0 standard drinks   Drug use: No   Sexual activity: Not on file  Other Topics Concern   Not on file  Social History Narrative   Not on file   Social Determinants of Health   Financial Resource Strain:    Difficulty of Paying Living Expenses: Not on file  Food Insecurity:    Worried About Running Out of Food in the Last Year: Not on file   Ran Out of Food in the Last Year: Not on file  Transportation Needs:    Lack of Transportation (Medical): Not on file   Lack of Transportation (Non-Medical): Not on file  Physical Activity:    Days of Exercise per Week: Not on file   Minutes of Exercise per Session: Not on file  Stress:    Feeling of Stress : Not on file  Social Connections:    Frequency of Communication with Friends and Family: Not on file   Frequency of Social  Gatherings with Friends and Family: Not on file   Attends Religious Services: Not on file   Active Member of Clubs or Organizations: Not on file   Attends BankerClub or Organization Meetings: Not on file   Marital Status: Not on file  Intimate Partner Violence:    Fear of Current or Ex-Partner: Not on file   Emotionally Abused: Not on file   Physically Abused: Not on file   Sexually Abused: Not on file   Social history is notable that she is married has one child one deceased. She does not routinely exercise. He denies alcohol use  or tobacco.   Family History  Problem Relation Age of Onset   Heart disease Mother    Heart attack Mother    Hypertension Mother    Hyperlipidemia Mother    Cancer - Other Mother        breast   Heart attack Father    Heart disease Father    Hyperlipidemia Father    Hypertension Father    Cancer - Other Sister        thyroid   Diabetes Brother    Cancer - Other Sister        breast   Cancer - Other Sister        breast;  had mastectomy   Hypertension Sister    ROS General: Negative; No fevers, chills, or night sweats; Positive for morbid obesity HEENT: Negative; No changes in vision or hearing, sinus congestion, difficulty swallowing Pulmonary: Negative; No cough, wheezing, shortness of breath, hemoptysis Cardiovascular: See history of present illness Positive for ankle swelling, intermittently GI: Negative; No nausea, vomiting, diarrhea, or abdominal pain GU: Negative; No dysuria, hematuria, or difficulty voiding Musculoskeletal: Negative; no myalgias, joint pain, or weakness Rheumatologic c: Positive for systemic lupus erythematosus Hematologic/Oncology: Negative; no easy bruising, bleeding Endocrine: Negative; no heat/cold intolerance; no diabetes Neuro: Negative; no changes in balance, headaches Skin: Negative; No rashes or skin lesions Psychiatric: Negative; No behavioral problems, depression Sleep: Positive for  obstructive sleep apnea, on CPAP machine failure, and need for new study.  Now with recurrent snoring, daytime sleepiness; nobruxism, restless legs, hypnogognic hallucinations, no cataplexy Other comprehensive 14 point system review is negative.   PE BP (!) 146/72 (BP Location: Right Arm, Patient Position: Sitting, Cuff Size: Large)    Pulse 71    Temp (!) 97.2 F (36.2 C)    Ht 5\' 1"  (1.549 m)    Wt 261 lb (118.4 kg)    BMI 49.32 kg/m    Repeat blood pressure by me 122/68  Wt Readings from Last 3 Encounters:  10/15/19 261 lb (118.4 kg)  05/21/19 255 lb (115.7 kg)  10/11/17 261 lb (118.4 kg)   General: Alert, oriented, no distress.  Morbidly obese Skin: normal turgor, no rashes, warm and dry HEENT: Normocephalic, atraumatic. Pupils equal round and reactive to light; sclera anicteric; extraocular muscles intact;  Nose without nasal septal hypertrophy Mouth/Parynx benign; Mallinpatti scale 4 Neck: No JVD, no carotid bruits; normal carotid upstroke Lungs: clear to ausculatation and percussion; no wheezing or rales Chest wall: without tenderness to palpitation Heart: PMI not displaced, RRR, s1 s2 normal, 1/6 systolic murmur, no diastolic murmur, no rubs, gallops, thrills, or heaves Abdomen: soft, nontender; no hepatosplenomehaly, BS+; abdominal aorta nontender and not dilated by palpation. Back: no CVA tenderness Pulses 2+ Musculoskeletal: full range of motion, normal strength, no joint deformities Extremities: no clubbing cyanosis or edema, Homan's sign negative  Neurologic: grossly nonfocal; Cranial nerves grossly wnl Psychologic: Normal mood and affect   ECG (independently read by me): Normal sinus rhythm at 71 bpm.  Isolated PVC.  Right bundle branch block with repolarization changes.  Inferior Q-wave in lead III.  QTc interval 447 ms.  December 2018 ECG (independently read by me): Normal sinus rhythm at 72 bpm.  Right bundle branch block with repolarization changes.  October  2018 ECG (independently read by me): Normal sinus rhythm at 73 bpm.  Right bundle branch block with repolarization changes.  QTc interval 460 ms.  June 2017 ECG (independently read by me): Normal sinus rhythm at 65  bpm.  Right bundle-branch block with repolarization changes.  Small nondiagnostic inferior Q waves.  T-wave abnormality laterally.  October 2016 ECG (independently read by me, and (: Normal sinus rhythm at 71 beats per minute.  Right bundle branch block with repolarization changes.  Q wave in lead 3.  November 2014 ECG: Sinus rhythm with right bundle branch block. First degree AV block with a PR interval of 208 ms   LABS:  BMP Latest Ref Rng & Units 10/07/2017 09/17/2013 02/20/2011  Glucose 65 - 99 mg/dL 91 834(H) 962(I)  BUN 8 - 27 mg/dL 10 14 17   Creatinine 0.57 - 1.00 mg/dL 2.97 9.89  BUN/Creat Ratio 12 - 28 13 - -  Sodium 134 - 144 mmol/L 144 138 141  Potassium 3.5 - 5.2 mmol/L 4.5 4.0 3.9  Chloride 96 - 106 mmol/L 105 104 104  CO2 20 - 29 mmol/L 24 26 27   Calcium 8.7 - 10.3 mg/dL 9.6 9.5 9.6       Component Value Date/Time   PROT 8.1 10/07/2017 1400   ALBUMIN 4.3 10/07/2017 1400   AST 34 10/07/2017 1400   ALT 23 10/07/2017 1400   ALKPHOS 84 10/07/2017 1400   BILITOT 0.3 10/07/2017 1400     CBC Latest Ref Rng & Units 10/07/2017 09/17/2013 02/20/2011  WBC 3.4 - 10.8 x10E3/uL 5.8 5.2 4.2  Hemoglobin 11.1 - 15.9 g/dL 09/19/2013 02/22/2011 94.1  Hematocrit 34.0 - 46.6 % 41.7 43.2 45.3  Platelets 150 - 379 x10E3/uL 228 205 178       Component Value Date/Time   PROBNP 858.0 (H) 09/09/2007 0340       Component Value Date/Time   CHOL 148 10/07/2017 1400   TRIG 68 10/07/2017 1400   HDL 68 10/07/2017 1400   CHOLHDL 2.2 10/07/2017 1400   CHOLHDL 2.4 02/04/2015 0957   VLDL 20 02/04/2015 0957   LDLCALC 66 10/07/2017 1400     RADIOLOGY: No results found.  IMPRESSION:  1. CAD in native artery   2. S/P CABG (coronary artery bypass graft)   3. OSA (obstructive sleep  apnea)   4. Essential hypertension, benign   5. Hyperlipidemia with target LDL less than 70   6. RBBB   7. Morbid obesity [facility])     ASSESSMENT AND PLAN: Ms. Yeats is a 68 year old female who is 10 years status post CABG revascularization surgery in October 2008.  Her prior nuclear studies have shown mild inferior abnormality.  Her last nuclear perfusion study which again shows a probable small region of inferobasal scar without associated ischemia.  The study was low risk.  Global ejection fraction was 61%.  At present, she continues to be asymptomatic without anginal symptoms.  Her blood pressure today is stable she has been on f benazepril 10 mg and metoprolol tartrate 50 mg twice a day.  She continues to be on rosuvastatin 20 mg which was changed from simvastatin several years ago.  Most recent lipid studies in July 2020 showed a total cholesterol 156, HDL 61, LDL 73 and triglycerides 110.  She has moderate least severe to severe sleep apnea particularly REM sleep.  Unfortunately, her CPAP unit is no longer functioning.  We will schedule her for split-night protocol for reassessment of her sleep apnea originally identified in 2010.  Covid testing will be necessary prior to her study.  I will then see her in sleep clinic in follow-up of getting her new machine.  She is morbidly obese with a BMI of 49.32.  We discussed the importance of weight loss and exercise both with reference to her vascular health as well as improvement in her sleep apnea.  Her ECG is stable and she has chronic right bundle branch block.  I will see her in several months for follow-up evaluation.  Time spent: 25 minutes  Lennette Bihari, MD, [facility]  10/22/2019 5:58 AM

## 2019-10-17 ENCOUNTER — Ambulatory Visit
Admission: RE | Admit: 2019-10-17 | Discharge: 2019-10-17 | Disposition: A | Discharge status: Home / Self Care | Payer: PPO | Source: Ambulatory Visit | Attending: Family Medicine | Admitting: Family Medicine

## 2019-10-17 ENCOUNTER — Other Ambulatory Visit: Payer: Self-pay

## 2019-10-17 DIAGNOSIS — Z1231 Encounter for screening mammogram for malignant neoplasm of breast: Secondary | ICD-10-CM

## 2019-10-22 ENCOUNTER — Encounter: Payer: Self-pay | Admitting: Cardiovascular Disease

## 2019-10-22 ENCOUNTER — Telehealth: Payer: Self-pay | Admitting: *Deleted

## 2019-10-22 NOTE — Telephone Encounter (Signed)
Patient notified of sleep study and COVID appointment details. 

## 2019-11-16 ENCOUNTER — Encounter (HOSPITAL_BASED_OUTPATIENT_CLINIC_OR_DEPARTMENT_OTHER): Payer: PPO | Admitting: Cardiovascular Disease

## 2019-11-18 ENCOUNTER — Other Ambulatory Visit (HOSPITAL_COMMUNITY)
Admission: RE | Admit: 2019-11-18 | Discharge: 2019-11-18 | Disposition: A | Discharge status: Home / Self Care | Payer: PPO | Source: Ambulatory Visit | Attending: Cardiovascular Disease | Admitting: Cardiovascular Disease

## 2019-11-18 DIAGNOSIS — Z01812 Encounter for preprocedural laboratory examination: Secondary | ICD-10-CM | POA: Insufficient documentation

## 2019-11-18 DIAGNOSIS — Z20822 Contact with and (suspected) exposure to covid-19: Secondary | ICD-10-CM | POA: Diagnosis not present

## 2019-11-18 LAB — SARS CORONAVIRUS 2 (TAT 6-24 HRS): SARS Coronavirus 2: NEGATIVE

## 2019-11-21 ENCOUNTER — Encounter (HOSPITAL_BASED_OUTPATIENT_CLINIC_OR_DEPARTMENT_OTHER): Payer: PPO | Admitting: Cardiovascular Disease

## 2019-11-22 ENCOUNTER — Ambulatory Visit (HOSPITAL_BASED_OUTPATIENT_CLINIC_OR_DEPARTMENT_OTHER): Payer: PPO | Attending: Cardiovascular Disease | Admitting: Cardiovascular Disease

## 2019-11-22 ENCOUNTER — Other Ambulatory Visit: Payer: Self-pay

## 2019-11-22 DIAGNOSIS — G4733 Obstructive sleep apnea (adult) (pediatric): Secondary | ICD-10-CM | POA: Diagnosis not present

## 2019-11-23 ENCOUNTER — Other Ambulatory Visit (HOSPITAL_BASED_OUTPATIENT_CLINIC_OR_DEPARTMENT_OTHER): Payer: Self-pay

## 2019-11-23 DIAGNOSIS — G4733 Obstructive sleep apnea (adult) (pediatric): Secondary | ICD-10-CM

## 2019-11-24 ENCOUNTER — Other Ambulatory Visit: Payer: Self-pay

## 2019-11-24 MED ORDER — METOPROLOL TARTRATE 50 MG PO TABS: ORAL_TABLET | ORAL | 1 refills | Status: DC

## 2019-11-24 MED ORDER — ROSUVASTATIN CALCIUM 20 MG PO TABS: ORAL_TABLET | ORAL | 1 refills | Status: DC

## 2019-12-01 ENCOUNTER — Encounter (HOSPITAL_BASED_OUTPATIENT_CLINIC_OR_DEPARTMENT_OTHER): Payer: Self-pay | Admitting: Cardiovascular Disease

## 2019-12-01 NOTE — Procedures (Signed)
Patient Name: [name], [name] Date: 11/22/2019 Gender: Female D.O.B: Dec 17, 1950 Age (years): 39 Referring Provider: Shelva Majestic MD, ABSM Height (inches): 61 Interpreting Physician: Shelva Majestic MD, ABSM Weight (lbs): 258 RPSGT: Baxter Flattery BMI: 83 MRN: [id number] Neck Size: 17.00  CLINICAL INFORMATION Sleep Study Type: Split Night CPAP  Indication for sleep study: OSA, Snoring, Witnessed Apneas  Epworth Sleepiness Score: 2  SLEEP STUDY TECHNIQUE As per the AASM Manual for the Scoring of Sleep and Associated Events v2.3 (April 2016) with a hypopnea requiring 4% desaturations.  The channels recorded and monitored were frontal, central and occipital EEG, electrooculogram (EOG), submentalis EMG (chin), nasal and oral airflow, thoracic and abdominal wall motion, anterior tibialis EMG, snore microphone, electrocardiogram, and pulse oximetry. Continuous positive airway pressure (CPAP) was initiated when the patient met split night criteria and was titrated according to treat sleep-disordered breathing.  MEDICATIONS benazepril (LOTENSIN) 10 MG tablet Cyanocobalamin (VITAMIN B12 PO) hydroxychloroquine (PLAQUENIL) 200 MG tablet metoprolol tartrate (LOPRESSOR) 50 MG tablet NON FORMULARY OVER THE COUNTER MEDICATION OVER THE COUNTER MEDICATION rosuvastatin (CRESTOR) 20 MG tablet   Medications self-administered by patient taken the night of the study : N/A  RESPIRATORY PARAMETERS Diagnostic Total AHI (/hr): 45.2 RDI (/hr): 48.1 OA Index (/hr): 36.9 CA Index (/hr): 0.0 REM AHI (/hr): N/A NREM AHI (/hr): 45.2 Supine AHI (/hr): N/A Non-supine AHI (/hr): 45.2 Min O2 Sat (%): 86.0 Mean O2 (%): 93.0 Time below 88% (min): 1.8   Titration Optimal Pressure (cm): 11 AHI at Optimal Pressure (/hr): 0.0 Min O2 at Optimal Pressure (%): 91.0 Supine % at Optimal (%): 0 Sleep % at Optimal (%): 100   SLEEP ARCHITECTURE The recording time for the entire night was 366.7  minutes.  During a baseline period of 148.6 minutes, the patient slept for 123.5 minutes in REM and nonREM, yielding a sleep efficiency of 83.1%%. Sleep onset after lights out was 12.2 minutes with a REM latency of N/A minutes. The patient spent 10.1%% of the night in stage N1 sleep, 89.9%% in stage N2 sleep, 0.0%% in stage N3 and 0% in REM.  During the titration period of 211.1 minutes, the patient slept for 179.5 minutes in REM and nonREM, yielding a sleep efficiency of 85.1%%. Sleep onset after CPAP initiation was 19.1 minutes with a REM latency of N/A minutes. The patient spent 3.6%% of the night in stage N1 sleep, 96.4%% in stage N2 sleep, 0.0%% in stage N3 and 0% in REM.  CARDIAC DATA The 2 lead EKG demonstrated sinus rhythm. The mean heart rate was 100.0 beats per minute. Other EKG findings include: PVCs.  LEG MOVEMENT DATA The total Periodic Limb Movements of Sleep (PLMS) were 0. The PLMS index was 0.0 .  IMPRESSIONS - Severe obstructive sleep apnea occurred during the diagnostic portion of the study (AHI 45.2/h; RDI 48.1/h) with absence of REM sleep on the diagnostic astudy.  An optimal PAP pressure was selected for this patient ( 11 cm of water) - No significant central sleep apnea occurred during the diagnostic portion of the study (CAI = 0.0/hour). - Moderate oxygen desaturation was noted during the diagnostic portion of the study to a nadir of 86.0%. - No snoring was audible during the diagnostic portion of the study. - EKG findings include PVCs. - Clinically significant periodic limb movements did not occur during sleep.  DIAGNOSIS - Obstructive Sleep Apnea (327.23 [G47.33 ICD-10])  RECOMMENDATIONS - Recommend an initial trial of CPAP therapy with EPR at 11 cm H2O with heated  humidification. A Small size Fisher[employer]Paykel Full Face Mask Simplus mask was used for the titration.  - Effort should be made to optimize nasal and oropharyngea patency. - Avoid alcohol, sedatives and other  CNS depressants that may worsen sleep apnea and disrupt normal sleep architecture. - Sleep hygiene should be reviewed to assess factors that may improve sleep quality. - Weight management (BMI 49) and regular exercise should be initiated or continued. - Recommend a download in 30 days and sleep clinic evaluation after 4 weeks of therapy.  [Electronically signed] 12/01/2019 06:24 PM  Shelva Majestic MD, [facility], ABSM Diplomate, American Board of Sleep Medicine   NPI: [id number] [place] PH: [phone]   FX: [phone] [place]

## 2019-12-03 ENCOUNTER — Telehealth: Payer: Self-pay | Admitting: *Deleted

## 2019-12-03 NOTE — Telephone Encounter (Signed)
Replacement CPAP machine order faxed to [facility].

## 2019-12-21 DIAGNOSIS — G4733 Obstructive sleep apnea (adult) (pediatric): Secondary | ICD-10-CM | POA: Diagnosis not present

## 2020-02-16 DIAGNOSIS — G4733 Obstructive sleep apnea (adult) (pediatric): Secondary | ICD-10-CM | POA: Diagnosis not present

## 2020-02-18 DIAGNOSIS — G4733 Obstructive sleep apnea (adult) (pediatric): Secondary | ICD-10-CM | POA: Diagnosis not present

## 2020-02-23 DIAGNOSIS — Z6841 Body Mass Index (BMI) 40.0 and over, adult: Secondary | ICD-10-CM | POA: Diagnosis not present

## 2020-02-23 DIAGNOSIS — M65331 Trigger finger, right middle finger: Secondary | ICD-10-CM | POA: Diagnosis not present

## 2020-02-23 DIAGNOSIS — M170 Bilateral primary osteoarthritis of knee: Secondary | ICD-10-CM | POA: Diagnosis not present

## 2020-02-23 DIAGNOSIS — M329 Systemic lupus erythematosus, unspecified: Secondary | ICD-10-CM | POA: Diagnosis not present

## 2020-02-23 DIAGNOSIS — R899 Unspecified abnormal finding in specimens from other organs, systems and tissues: Secondary | ICD-10-CM | POA: Diagnosis not present

## 2020-02-23 DIAGNOSIS — M79604 Pain in right leg: Secondary | ICD-10-CM | POA: Diagnosis not present

## 2020-03-01 ENCOUNTER — Telehealth: Payer: Self-pay | Admitting: Cardiovascular Disease

## 2020-03-01 NOTE — Telephone Encounter (Signed)
Pt c/o medication issue:  1. Name of Medication: benazepril (LOTENSIN) 10 MG tablet  2. How are you currently taking this medication (dosage and times per day)? 5mg  daily  3. Are you having a reaction (difficulty breathing--STAT)? no  4. What is your medication issue? from Tobi Bastos is calling because she states that the patient is taking 5mg  daily rather than 10mg  daily. She wants to know if the instructions on the medication can be changed to show this. If you call her and she does not answer please leave a VM with the patients name in regards to who you are calling about.

## 2020-03-09 NOTE — Telephone Encounter (Signed)
ok 

## 2020-03-10 ENCOUNTER — Other Ambulatory Visit: Payer: Self-pay

## 2020-03-10 MED ORDER — BENAZEPRIL HCL 10 MG PO TABS: 5.0000 mg | ORAL_TABLET | Freq: Every day | ORAL | 3 refills | Status: DC

## 2020-03-10 NOTE — Telephone Encounter (Signed)
Called and spoke with pt. She states that her original cardiologist Dr.McQueen was the doctor that started her on the Benazepril 5mg  in 2008 and this hasn't changed since then. She is unsure where the 10mg  prescription with instructions to take the entire tablet has come from but she has always cut the pill in half and taken 5mg . She said her insurance company called and made it sound as if she wasn't taking her medication.  Notified I would make Dr.Kelly aware of the situation and update the signature on her prescription to state to only take 5mg  (1/2) tablet daily.  Pt thankful for the call.

## 2020-03-19 DIAGNOSIS — G4733 Obstructive sleep apnea (adult) (pediatric): Secondary | ICD-10-CM | POA: Diagnosis not present

## 2020-03-21 NOTE — Telephone Encounter (Signed)
ok 

## 2020-03-28 DIAGNOSIS — G4733 Obstructive sleep apnea (adult) (pediatric): Secondary | ICD-10-CM | POA: Diagnosis not present

## 2020-03-29 ENCOUNTER — Ambulatory Visit: Payer: PPO | Admitting: Cardiovascular Disease

## 2020-03-29 ENCOUNTER — Other Ambulatory Visit: Payer: Self-pay

## 2020-03-29 VITALS — BP 130/76 | HR 60 | Temp 96.8°F | Ht 61.5 in | Wt 270.0 lb

## 2020-03-29 DIAGNOSIS — G4733 Obstructive sleep apnea (adult) (pediatric): Secondary | ICD-10-CM

## 2020-03-29 DIAGNOSIS — IMO0002 Reserved for concepts with insufficient information to code with codable children: Secondary | ICD-10-CM

## 2020-03-29 DIAGNOSIS — E785 Hyperlipidemia, unspecified: Secondary | ICD-10-CM

## 2020-03-29 DIAGNOSIS — I4510 Unspecified right bundle-branch block: Secondary | ICD-10-CM

## 2020-03-29 DIAGNOSIS — I10 Essential (primary) hypertension: Secondary | ICD-10-CM | POA: Diagnosis not present

## 2020-03-29 DIAGNOSIS — M329 Systemic lupus erythematosus, unspecified: Secondary | ICD-10-CM

## 2020-03-29 DIAGNOSIS — I2510 Atherosclerotic heart disease of native coronary artery without angina pectoris: Secondary | ICD-10-CM | POA: Diagnosis not present

## 2020-03-29 DIAGNOSIS — Z951 Presence of aortocoronary bypass graft: Secondary | ICD-10-CM

## 2020-03-29 NOTE — Progress Notes (Signed)
Patient ID: [name], female   DOB: 06/12/1951, 69 y.o.   MRN: [id number]      Primary M.D.: Dr. Carol Ada   HPI: [name] is a 69 y.o. female who presents to the office for a 6 month follow-up cardiology evaluation.  [name] has a history of coronary artery disease in October 2008 underwent CABG surgery by Dr. Cyndia Bent with a LIMA to the LAD, vein to the obtuse marginal, vein to the RCA. Her last nuclear perfusion study in October 2013 was unchanged and showed mild inferior abnormality.  She has a history of moderate obstructive sleep apnea which is severe during REM sleep. She was initially started on CPAP therapy 2010 but when I  saw her in April 2014 she had not been using therapy for over the past year. At that time, I strongly advise reinstitution of CPAP therapy.  She now uses [employer] as her DME company.  She recently received a new mask, which is a full face mask.  She admits to 100% compliance.  She is unaware of breakthrough snoring.  She denies excessive daytime sleepiness.  Additional problems include hyperlipidemia, right bundle branch block, systemic lupus erythematosus, as well as obesity.  She sees Dr. Amil Amen for her lupus.  When I saw her in October 2017, she had experienced an episode of nonexertional chest tightness which lasted for several minutes.   She had not been very active due to bilateral deep discomfort. She had not been very active due to bilateral knee pain.  In June 2017 an echo Doppler study showed an EF of 60-65% with grade 2 diastolic dysfunction.  Over the past year, she has continued to use CPAP with 100% compliance.  She has been on aspirin.  benazapril 10 mg, HCTZ as needed, and metoprolol 50 mg twice a day.  When I saw her last year, she was changed from simvastatin to rosuvastatin for more aggressive lipid intervention.  She continues to be on rosuvastatin 20 mg for hyperlipidemia.  She underwent a nuclear perfusion study for a 10 year  follow-up evaluation in October 2018.  This remained low risk and showed a small inferobasilar defect, without associated ischemia and with akinesis of this info basal segment.  Ejection fraction was 61%.  She underwent laboratory on 10/07/2017 which showed a total cluster 148, HDL 68, LDL 66, and triglycerides 68.    In July 2020, she was evaluated in a 43-month follow-up telemedicine visit.  Since I previously seen her she had remained stable from a cardiac standpoint.  Unfortunately, her CPAP therapy stopped working last year and has not been on therapy.  She had not been sleeping as well.  She denied recurrent anginal symptomatology or shortness of breath.  She was having difficulty with bilateral knee discomfort and therefore was not able to walk well.  She continues to be followed by Dr. Amil Amen for lupus.  Since she had her initial sleep study in  2010 demonstrated severe sleep apnea with severe sleep apnea during REM sleep I recommended the patient to undergo a split night protocol to reassess her OSA with plan to prescribe a new CPAP machine.  I last saw her in December 2020. Apparently, due to the COVID-19 pandemic she did not have her follow-up sleep evaluation.  She continued to use stationary bike for exercise.  She has been caring for her husband with dementia.    She underwent a follow-up sleep study on November 22, 2019.  This again showed  severe obstructive sleep apnea with an AHI of 45.2 and with absence of rem sleep during the diagnostic portion of the study.  She qualified for split-night protocol and CPAP titration was initiated and titrated up to 11 cm water pressure.  She is now back on CPAP therapy.  I obtained a download initially from February 15 through January 19, 2020.  Usage days was 87% with average usage 7 hours and 14 minutes.  At 11 cm water pressure, AHI was 3.2.  Subsequently, her mask split and [employer] is sending a new fullface mask.  She has felt better since reinitiating  CPAP therapy.  She has more energy.  She is unaware of breakthrough snoring.  She is unaware of nocturnal palpitations.  Past Medical History:  Diagnosis Date  . CAD (coronary artery disease)   . Hyperlipidemia   . Hypertension   . Obesity   . OSA (obstructive sleep apnea)   . RBBB   . Sleep apnea   . Systemic lupus erythematosus (HCC)     Past Surgical History:  Procedure Laterality Date  . CARDIAC CATHETERIZATION  09/02/2007  . CORONARY ANGIOPLASTY    . CORONARY ARTERY BYPASS GRAFT  09/02/2007   LIMA to the LAD,SVG to obtuse marginal branch of the left CX,SVG to the RCA    Allergies  Allergen Reactions  . Aspirin Nausea And Vomiting  . Meloxicam Hives    Current Outpatient Medications  Medication Sig Dispense Refill  . benazepril (LOTENSIN) 10 MG tablet Take 0.5 tablets (5 mg total) by mouth daily. 45 tablet 3  . Cyanocobalamin (VITAMIN B12 PO) Take 1 tablet by mouth daily.    . hydroxychloroquine (PLAQUENIL) 200 MG tablet Take by mouth daily.    . metoprolol tartrate (LOPRESSOR) 50 MG tablet TAKE 1 TABLET (50 MG TOTAL) BY MOUTH 2 (TWO) TIMES DAILY. 180 tablet 1  . NON FORMULARY CPAP therapy    . OVER THE COUNTER MEDICATION Take 1 capsule by mouth daily. Complete omega    . OVER THE COUNTER MEDICATION Take 1 tablet by mouth daily. Active Adult 50 plus    . rosuvastatin (CRESTOR) 20 MG tablet TAKE 1 TABLET BY MOUTH DAILY. 90 tablet 1   No current facility-administered medications for this visit.    Social History   Socioeconomic History  . Marital status: Married    Spouse name: Not on file  . Number of children: Not on file  . Years of education: Not on file  . Highest education level: Not on file  Occupational History  . Not on file  Tobacco Use  . Smoking status: Never Smoker  . Smokeless tobacco: Never Used  Substance and Sexual Activity  . Alcohol use: No    Alcohol/week: 0.0 standard drinks  . Drug use: No  . Sexual activity: Not on file  Other  Topics Concern  . Not on file  Social History Narrative  . Not on file   Social Determinants of Health   Financial Resource Strain:   . Difficulty of Paying Living Expenses:   Food Insecurity:   . Worried About Programme researcher, broadcasting/film/videounning Out of Food in the Last Year:   . Baristaan Out of Food in the Last Year:   Transportation Needs:   . Freight forwarderLack of Transportation (Medical):   [name] Kitchen. Lack of Transportation (Non-Medical):   Physical Activity:   . Days of Exercise per Week:   . Minutes of Exercise per Session:   Stress:   . Feeling of Stress :  Social Connections:   . Frequency of Communication with Friends and Family:   . Frequency of Social Gatherings with Friends and Family:   . Attends Religious Services:   . Active Member of Clubs or Organizations:   . Attends Banker Meetings:   [name] Kitchen Marital Status:   Intimate Partner Violence:   . Fear of Current or Ex-Partner:   . Emotionally Abused:   [name] Kitchen Physically Abused:   . Sexually Abused:    Social history is notable that she is married has one child one deceased. She does not routinely exercise. He denies alcohol use or tobacco.   Family History  Problem Relation Age of Onset  . Heart disease Mother   . Heart attack Mother   . Hypertension Mother   . Hyperlipidemia Mother   . Cancer - Other Mother        breast  . Heart attack Father   . Heart disease Father   . Hyperlipidemia Father   . Hypertension Father   . Cancer - Other Sister        thyroid  . Diabetes Brother   . Cancer - Other Sister        breast  . Cancer - Other Sister        breast;  had mastectomy  . Hypertension Sister    ROS General: Negative; No fevers, chills, or night sweats; Positive for morbid obesity HEENT: Negative; No changes in vision or hearing, sinus congestion, difficulty swallowing Pulmonary: Negative; No cough, wheezing, shortness of breath, hemoptysis Cardiovascular: See history of present illness Positive for ankle swelling, intermittently GI:  Negative; No nausea, vomiting, diarrhea, or abdominal pain GU: Negative; No dysuria, hematuria, or difficulty voiding Musculoskeletal: Negative; no myalgias, joint pain, or weakness Rheumatologic c: Positive for systemic lupus erythematosus Hematologic/Oncology: Negative; no easy bruising, bleeding Endocrine: Negative; no heat/cold intolerance; no diabetes Neuro: Negative; no changes in balance, headaches Skin: Negative; No rashes or skin lesions Psychiatric: Negative; No behavioral problems, depression Sleep: Positive for obstructive sleep apnea, now back on CPAP therapy; [employer] is her DME company,no bruxism, restless legs, hypnogognic hallucinations, no cataplexy Other comprehensive 14 point system review is negative.   PE BP 130/76   Pulse 60   Temp (!) 96.8 F (36 C)   Ht 5' 1.5" (1.562 m)   Wt 270 lb (122.5 kg)   SpO2 92%   BMI 50.19 kg/m    Repeat blood pressure by me 128/74  Wt Readings from Last 3 Encounters:  03/29/20 270 lb (122.5 kg)  11/22/19 258 lb (117 kg)  10/15/19 261 lb (118.4 kg)   General: Alert, oriented, no distress.  Skin: normal turgor, no rashes, warm and dry HEENT: Normocephalic, atraumatic. Pupils equal round and reactive to light; sclera anicteric; extraocular muscles intact;  Nose without nasal septal hypertrophy Mouth/Parynx benign; Mallinpatti scale 4 Neck: Thick neck; no JVD, no carotid bruits; normal carotid upstroke Lungs: clear to ausculatation and percussion; no wheezing or rales Chest wall: without tenderness to palpitation Heart: PMI not displaced, RRR, s1 s2 normal, 1/6 systolic murmur, no diastolic murmur, no rubs, gallops, thrills, or heaves Abdomen: Central adiposity soft, nontender; no hepatosplenomehaly, BS+; abdominal aorta nontender and not dilated by palpation. Back: no CVA tenderness Pulses 2+ Musculoskeletal: full range of motion, normal strength, no joint deformities Extremities: no clubbing cyanosis or edema, Homan's sign  negative  Neurologic: grossly nonfocal; Cranial nerves grossly wnl Psychologic: Normal mood and affect   ECG (independently read by me): Sinus rhythm  with mild sinus arrhythmia.  First-degree AV block with a parable of 218 ms.  Q-wave in lead III.  Bundle branch block with repolarization changes.  December 2020 ECG (independently read by me): Normal sinus rhythm at 71 bpm.  Isolated PVC.  Right bundle branch block with repolarization changes.  Inferior Q-wave in lead III.  QTc interval 447 ms.  December 2018 ECG (independently read by me): Normal sinus rhythm at 72 bpm.  Right bundle branch block with repolarization changes.  October 2018 ECG (independently read by me): Normal sinus rhythm at 73 bpm.  Right bundle branch block with repolarization changes.  QTc interval 460 ms.  June 2017 ECG (independently read by me): Normal sinus rhythm at 65 bpm.  Right bundle-branch block with repolarization changes.  Small nondiagnostic inferior Q waves.  T-wave abnormality laterally.  October 2016 ECG (independently read by me, and (: Normal sinus rhythm at 71 beats per minute.  Right bundle branch block with repolarization changes.  Q wave in lead 3.  November 2014 ECG: Sinus rhythm with right bundle branch block. First degree AV block with a PR interval of 208 ms   LABS:  BMP Latest Ref Rng & Units 10/07/2017 09/17/2013 02/20/2011  Glucose 65 - 99 mg/dL 91 578(I) 696(E)  BUN 8 - 27 mg/dL 10 14 17   Creatinine 0.57 - 1.00 mg/dL 9.52 8.41  BUN/Creat Ratio 12 - 28 13 - -  Sodium 134 - 144 mmol/L 144 138 141  Potassium 3.5 - 5.2 mmol/L 4.5 4.0 3.9  Chloride 96 - 106 mmol/L 105 104 104  CO2 20 - 29 mmol/L 24 26 27   Calcium 8.7 - 10.3 mg/dL 9.6 9.5 9.6       Component Value Date/Time   PROT 8.1 10/07/2017 1400   ALBUMIN 4.3 10/07/2017 1400   AST 34 10/07/2017 1400   ALT 23 10/07/2017 1400   ALKPHOS 84 10/07/2017 1400   BILITOT 0.3 10/07/2017 1400     CBC Latest Ref Rng & Units  10/07/2017 09/17/2013 02/20/2011  WBC 3.4 - 10.8 x10E3/uL 5.8 5.2 4.2  Hemoglobin 11.1 - 15.9 g/dL 09/19/2013 02/22/2011 40.1  Hematocrit 34.0 - 46.6 % 41.7 43.2 45.3  Platelets 150 - 379 x10E3/uL 228 205 178       Component Value Date/Time   PROBNP 858.0 (H) 09/09/2007 0340       Component Value Date/Time   CHOL 148 10/07/2017 1400   TRIG 68 10/07/2017 1400   HDL 68 10/07/2017 1400   CHOLHDL 2.2 10/07/2017 1400   CHOLHDL 2.4 02/04/2015 0957   VLDL 20 02/04/2015 0957   LDLCALC 66 10/07/2017 1400     RADIOLOGY: No results found.  IMPRESSION:  1. CAD in native artery   2. S/P CABG (coronary artery bypass graft)   3. OSA (obstructive sleep apnea)   4. Essential hypertension, benign   5. Hyperlipidemia with target LDL less than 70   6. Morbid obesity (HCC)   7. RBBB   8. Lupus [facility])    ASSESSMENT AND PLAN: Ms. Luft is a 69 year old female with known CAD who is status post CABG revascularization surgery in October 2008.  Her prior nuclear studies have shown mild inferior abnormality.  Her last nuclear perfusion study which again shows a probable small region of inferobasal scar without associated ischemia.  The study was low risk.  Global ejection fraction was 61%.  At present, she continues to be asymptomatic without anginal symptoms.  Her blood pressure today is stable on  her current regimen consisting of benazepril 10 mg, metoprolol tartrate 50 mg twice a day.  She continues to be on rosuvastatin 20 mg for hyperlipidemia.  LDL cholesterol in July 2020 was 73.  She recently underwent reevaluation for sleep apnea which again confirmed severe sleep apnea with an overall AHI of 45.2/h.  She was unable to achieve any REM sleep on the diagnostic portion of the study and ultimately was titrated to 11 cm water pressure.  She is meeting compliance standards based on her initial download following reinstitution of CPAP therapy.  Recently, her mask has broken and [employer] will be sending another  fullface mask.  She has more energy.  Typically she still goes to bed very late typically between 1 and 2 AM and wakes up between 8 and 9 AM.  There is no residual daytime sleepiness.  BMI is consistent with super morbid obesity at 50.19.  Weight loss and increased exercise was recommended.  She is followed by Dr. Dierdre Forth for her lupus erythematosus.  She has chronic right bundle branch block which is stable.  I will see her in 1 year for reevaluation or sooner as needed.    Lennette Bihari, MD, [facility]  04/04/2020 11:54 AM

## 2020-03-29 NOTE — Patient Instructions (Signed)
Medication Instructions:  CONTINUE WITH CURRENT MEDICATIONS. NO CHANGES.  *If you need a refill on your cardiac medications before your next appointment, please call your pharmacy*  Follow-Up: At [facility], you and your health needs are our priority.  As part of our continuing mission to provide you with exceptional heart care, we have created designated Provider Care Teams.  These Care Teams include your primary Cardiologist (physician) and Advanced Practice Providers (APPs -  Physician Assistants and Nurse Practitioners) who all work together to provide you with the care you need, when you need it.  We recommend signing up for the patient portal called "MyChart".  Sign up information is provided on this After Visit Summary.  MyChart is used to connect with patients for Virtual Visits (Telemedicine).  Patients are able to view lab/test results, encounter notes, upcoming appointments, etc.  Non-urgent messages can be sent to your provider as well.   To learn more about what you can do with MyChart, go to [email or web address].    Your next appointment:   12 month(s)  The format for your next appointment:   In Person  Provider:   Nicki Guadalajara, MD   Other Instructions New mask [phone]

## 2020-03-31 ENCOUNTER — Telehealth: Payer: Self-pay | Admitting: *Deleted

## 2020-03-31 ENCOUNTER — Telehealth: Payer: Self-pay | Admitting: Cardiovascular Disease

## 2020-03-31 DIAGNOSIS — G4733 Obstructive sleep apnea (adult) (pediatric): Secondary | ICD-10-CM

## 2020-03-31 NOTE — Telephone Encounter (Signed)
Per Dr Tresa Endo order placed for a New mask [id number] to [facility]

## 2020-03-31 NOTE — Telephone Encounter (Signed)
   Pt following up his full face mask for her cpap machine. Transferred to [employer]

## 2020-04-04 ENCOUNTER — Encounter: Payer: Self-pay | Admitting: Cardiovascular Disease

## 2020-04-19 DIAGNOSIS — M3210 Systemic lupus erythematosus, organ or system involvement unspecified: Secondary | ICD-10-CM | POA: Diagnosis not present

## 2020-04-19 DIAGNOSIS — G4733 Obstructive sleep apnea (adult) (pediatric): Secondary | ICD-10-CM | POA: Diagnosis not present

## 2020-04-19 DIAGNOSIS — H04123 Dry eye syndrome of bilateral lacrimal glands: Secondary | ICD-10-CM | POA: Diagnosis not present

## 2020-04-19 DIAGNOSIS — Z79899 Other long term (current) drug therapy: Secondary | ICD-10-CM | POA: Diagnosis not present

## 2020-04-19 DIAGNOSIS — H40023 Open angle with borderline findings, high risk, bilateral: Secondary | ICD-10-CM | POA: Diagnosis not present

## 2020-04-21 ENCOUNTER — Other Ambulatory Visit: Payer: Self-pay

## 2020-04-21 NOTE — Patient Outreach (Signed)
[facility] Evaluation Interviewer made contact with patient. Aging Gracefully survey completed.   Interviewer will send referral to RN and OT for follow up.  [name] [facility] Management Assistant [phone]

## 2020-05-16 DIAGNOSIS — I10 Essential (primary) hypertension: Secondary | ICD-10-CM | POA: Diagnosis not present

## 2020-05-16 DIAGNOSIS — I2510 Atherosclerotic heart disease of native coronary artery without angina pectoris: Secondary | ICD-10-CM | POA: Diagnosis not present

## 2020-05-16 DIAGNOSIS — F439 Reaction to severe stress, unspecified: Secondary | ICD-10-CM | POA: Diagnosis not present

## 2020-05-16 DIAGNOSIS — M329 Systemic lupus erythematosus, unspecified: Secondary | ICD-10-CM | POA: Diagnosis not present

## 2020-05-16 DIAGNOSIS — Z1389 Encounter for screening for other disorder: Secondary | ICD-10-CM | POA: Diagnosis not present

## 2020-05-16 DIAGNOSIS — Z6841 Body Mass Index (BMI) 40.0 and over, adult: Secondary | ICD-10-CM | POA: Diagnosis not present

## 2020-05-16 DIAGNOSIS — E7800 Pure hypercholesterolemia, unspecified: Secondary | ICD-10-CM | POA: Diagnosis not present

## 2020-05-16 DIAGNOSIS — Z0000 Encounter for general adult medical examination without abnormal findings: Secondary | ICD-10-CM | POA: Diagnosis not present

## 2020-05-17 ENCOUNTER — Other Ambulatory Visit: Payer: Self-pay

## 2020-05-17 ENCOUNTER — Other Ambulatory Visit: Payer: Self-pay | Admitting: Occupational Therapy

## 2020-05-17 NOTE — Patient Outreach (Signed)
Aging Gracefully Program  OT Initial Visit  05/17/2020  [name] [birth year] [id number]  Visit:  1- Initial Visit  Start Time:  1130 End Time:  1245 Total Minutes:  75  CCAP: Typical Daily Routine: Typical Daily Routine:: Pt is at home unless she is taking her husband to appointments. He has multiple appointments per week with MDs and PT.   What Types Of Care Problems Are You Having Throughout The Day?: Difficulty getting in/out of the house, difficulty with safety in the bathroom, and difficulty getting up from chairs.   What Kind Of Help Do You Receive?: None  Do You Think You Need Other Types Of Help?: No  What Do You Think Would Make Everyday Life Easier For You?: Modifications to the home for safety. Sturdy objects to hold onto such as grab bars.   What Is A Good Day Like?: Getting up and outside and to appointments without difficulty.   What Is A Bad Day Like?: Difficulty getting husband in/out of house, near falls, trying to help care for husband with more difficulty than usual  Do You Have Time For Yourself?: yes  Patient Reported Equipment: Patient Reported Equipment Currently Used: Shower Chair Other Equipment:: Husband has a RW and a Programme researcher, broadcasting/film/video [employer] the [employer]:   Functional Mobility-Walk A Block: Walk A Block: Unable To Do Do You:: No Device/No Assistance Importance Of Learning New Strategies:: Not At All   Functional Mobility-Maintain Balance While Showering: Maintaining Balance While Showering: Moderate Difficulty Do You:: Use A Device Importance Of Learning New Strategies:: Moderate Other Comments:: has a shower seat, needs grab bars Observation: Maintain Balance While Showering: Supervision Safety: A Little Risk Efficiency: Somewhat Intervention: Yes Other Comments:: Needs grab bars installed   Functional Mobility-Stooping, Crouching, Kneeling To Retreive Item: Stooping, Crouching, or Kneeling To  Retrieve Item: A Lot Of Difficulty Do You:: No Device/No Assistance Importance Of Learning New Strategies:: Very Much Observation: Gayland Curry, or Kneel: Moderate Assistance Safety: Moderate/Extreme Risk Efficiency: Somewhat Intervention: Yes Other Comments:: Needs a reacher   Functional Mobility-Bending From Standing Position To Pick Up Clothing Off The Floor: Bending Over From Standing Position To Pick Up Clothing Off The Floor: A Lot Of Difficulty Do You:: No Device/No Assistance Importance Of Learning New Strategies:: Very Much Observation: Bending Over From Standing Postion To Pick Up Clothing Off Of Floor: Independent With Pain, Difficulty, Or Use Of Device Safety: A Little Risk Efficiency: Somewhat Intervention: Yes Other Comments:: Needs a reacher   Functional Mobility-Reaching For Items Above Shoulder Level: Reaching For Items Above Shoulder Level: Moderate Difficulty Do You:: No Device/No Assistance Importance Of Learning New Strategies:: Moderate Other Comments:: Pt's height prevents ability to reach items overhead Observation: Reaching For Items Above Shoulder Level: Independent With Pain, Difficulty, Or Use Of Device Safety: Moderate/Extreme Risk Efficiency: Somewhat Intervention: Yes Other Comments:: Needs a reacher   Functional Mobility-Climb 1 Flight Of Stairs: Climb 1 Flight Of Stairs: Moderate Difficulty Do You:: No Device/No Assistance Importance Of Learning New Strategies:: Moderate Other Comments:: pt has knee pain Observation: Climb One Flight Of Stairs: Independent With Pain, Difficulty, Or Use Of Device Safety: Moderate/Extreme Risk Efficiency: Somewhat Intervention: Yes Other Comments:: Needs ramp for home entry/exit   Functional Mobility-Move In And Out Of Chair: Move In and Out Of A Chair: A Lot Of Difficulty Do You:: No Device/No Assistance Importance Of Learning New Strategies:: Very Much Observation: Move In And Out Of Chair: Independent  With Pain, Difficulty, Or Use  Of Device Safety: Moderate/Extreme Risk Efficiency: Not At All Intervention: Yes Other Comments:: Needs furniture raised, possible couch rail   Functional Mobility-Move In And Out Of Bed: Move In and Out Of Bed: Moderate Difficulty Do You:: No Device/No Assistance Importance Of Learning New Strategies:: Moderate Observation: Move In and Out Of Bed: N/O Safety: Moderate/Extreme Risk Efficiency: Somewhat Intervention: Yes Other Comments:: bedrail   Functional Mobility-Move In And Out Of Bath/Shower: Move In And Out Of A Bath/Shower: A Lot Of Difficulty Do You:: No Device/No Assistance Importance Of Learning New Strategies:: Very Much Other Comments:: bathroom is very small Observation: Move In And Out Of Bath/Shower: N/O Safety: Moderate/Extreme Risk Efficiency: Somewhat Intervention: Yes Other Comments:: Assess shower, install grab bars   Functional Mobility-Get On And Off Toilet: Getting Up From The Floor: Unable To Do Do You:: No Device/No Assistance Importance Of Learning New Strategies:: A Little Other Comments:: has to call fire/EMS for assist Observation: Getting Up From The Floor: N/O Safety: N/O Efficiency: N/O Intervention: No   Functional Mobility-Into And Out Of Car, Not Including Driving: Into  And Out Of Car, Not Including Driving: A Little Difficulty Do You:: No Device/No Assistance Importance Of Learning New Strategies:: Not At All   Functional Mobility-Other Mobility Difficulty:      Activities of Daily Living-Bathing/Showering: ADL-Bathing/Showering: Moderate Difficulty Do You:: No Device/No Assistance Importance Of Learning New Strategies: Moderate Other Comments:: has difficulty reaching feet and back ADL Observation: Bathing/Showering: N/O Safety: A Little Risk Efficiency: Somewhat Intervention: Yes Other Comments:: provide AE   Activities of Daily Living-Personal Hygiene and Grooming: Personal Hygiene and  Grooming: No Difficulty   Activities of Daily Living-Toilet Hygiene: Toilet Hygiene: No Difficulty   Activities of Daily Living-Put On And Take Off Undergarments (Incl. Fasteners): Put On And Take Off Undergarments (Incl. Fasteners): No Difficulty   Activities of Daily Living-Put On And Take Off Shirt/Dress/Coat (Incl. Fasteners): Put On And Take Off Shirt/Dress/Coat (Incl. Fasteners): No Difficulty   Activities of Daily Living-Put On And Take Off Socks And Shoes: Put On And Take Off Socks And  Shoes: A Lot of Difficulty Do You:: No Device/No Assistance Importance Of Learning New Strategies: Very Much ADL Observation: Put On And Take Off Socks And Shoes: Independent With Pain, Difficulty, Or Use Of Device Safety: A Little Risk Efficiency: Not At All Intervention: Yes Other Comments:: needs sock aide and shoe horn   Activities of Daily Living-Feed Self: Feed Self: No Difficulty   Activities of Daily Living-Rest And Sleep: Rest and Sleep: A Little Difficulty Do You:: No Device/No Assistance Importance Of Learning New Strategies: Moderate ADL Observation: Rest And Sleep: N/O Safety: N/O Efficiency: N/O Intervention: No   Activities of Daily Living-Sexual Activity: Sexual  Activity: N/A   Activities of Daily Living-Other Activity Identified:    Instrumental Activities of Daily Living-Light Homemaking (Laundry, Straightening Up, Vacuuming):  Do Light Homemaking (Laundry, Straightening Up, Vacuuming): No Difficulty   Instrumental Activities of Daily Living-Making A Bed: Making a Bed: No Difficulty   Instrumental Activities of Daily Living-Washing Dishes By Hand While Standing At The Sink: Washing Dishes By Hand While Standing At The Sink: No Difficulty   Instrumental Activities of Daily Living-Grocery Shopping: Do Grocery Shopping: No Difficulty   Instrumental Activities of Daily Living-Use Telephone: Use Telephone: No Difficulty   Instrumental Activities of Daily  Living-Financial Management: Financial Management: No Difficulty   Instrumental Activities of Daily Living-Medications: Take Medications: No Difficulty   Instrumental Activities of Daily Living-Health Management And Maintenance: Health  Management & Maintenance: No Difficulty   Instrumental Activities of Daily Living-Meal Preparation and Clean-Up: Meal Preparation and Clean-Up: No Difficulty   Instrumental Activities of Daily Living-Provide Care For Others/Pets: Care For Others/Pets: N/A   Instrumental Activities of Daily Living-Take Part In Organized Social Activities: Take Part In Organized Social Activities: N/A   Instrumental Activities of Daily Living-Leisure Participation: Leisure Participation: No Difficulty   Instrumental Activities of Daily Living-Employment/Volunteer Activities: Employment/Volunteer Activities: N/A   Instrumental Activities of Daily Living-Other Identifies:    Readiness To Change Score:  Readiness to Change Score: 8  Home Environment Assessment: Outside Home Entry:: Steep steps. Needs a ramp for safety Living Room:: couch is low to the ground and pt has significant difficulty getting up from the couch Bathroom:: Low commode, tub shower. Pt and husband both with significant difficulty clearing tub to step in, no grab bars Master Bedroom:: king sized bed, very narrow walkways  Durable Medical Equipment:    Patient Education: Education Provided: Yes Education Details: Check for Teacher, adult education) Educated: Patient, Spouse Comprehension: Verbalized Understanding, Other (comment) (provided handout)  Goals: Goals Addressed            This Visit's Progress   . Patient Stated       Pt will improve ability to perform LB dressing tasks safely and independently using appropriate AE.     [name] Kitchen Patient Stated       Pt will improve ability to get into and out of the house safely and independently.     . Patient Stated       Pt will improve  ability to get into and out of the shower/tub and perform LB bathing tasks safely and independently.     . Patient Stated       Pt will improve ability to get up from the couch and commode, and out of bed safely and independently.        Post Clinical Reasoning: Clinician View Of Client Situation:: Pt is primary caregiver for her husband who has mobility issues and pt is providing a lot of physical assistance at risk to her own safety. Pt with knee pain which limits mobility for getting up from the couch or bending down during ADLs.  Client View Of His/Her Situation:: Pt is primarily concerned about mobility getting into/out of the house with her husband, and with the bathroom situation. However after explaining the programs and potential options available pt is very interested in learning how to perform ADLs safely and with greater ease.  Next Visit Plan:: Provide DME for LB dressing and educate on use    Ezra Sites, OTR/L  [phone] 05/17/20

## 2020-05-19 ENCOUNTER — Other Ambulatory Visit: Payer: Self-pay | Admitting: *Deleted

## 2020-05-19 DIAGNOSIS — G4733 Obstructive sleep apnea (adult) (pediatric): Secondary | ICD-10-CM | POA: Diagnosis not present

## 2020-05-19 NOTE — Patient Outreach (Signed)
Aging Gracefully Program  05/19/2020  MAYBELL MISENHEIMER 06/28/1951 [id number]  Placed call to client Cayleen Benjamin to schedule initial Aging Gracefully RN home visit  HIPAA compliant voice mail message left for patient, requesting return call back.  Plan:  Will re-attempt [facility] CM telephone outreach again tomorrow if I do not hear back from client first.  [name], RN, BSN, CCRN Alumnus [facility] Care Management  [phone]

## 2020-05-20 ENCOUNTER — Other Ambulatory Visit: Payer: Self-pay | Admitting: *Deleted

## 2020-05-20 NOTE — Patient Outreach (Signed)
Aging Gracefully Program [birth year] [id number]   Placed call to client Luccia "[name]" [place] to schedule initial Aging Gracefully RN home visit; HIPAA/ identity verified   Visit scheduled with [name] for Thursday Jun 09, 2020 at 12:00 pm; verified patient's address and explained that I would contact her by phone on day of scheduled visit prior to arriving at her home, to complete coronavirus questionnaire screening tool; confirmed that patient has a mask available at her home for use during scheduled visit.   Provided my direct contact information to patient, should she wish to contact me prior to scheduled home visit.   Plan:   Scheduled RN initial home visit on Thursday June 09, 2020 at 12:00 pm  [name], [place], BSN, [employer] Coordinator [facility] Care Management  [phone]

## 2020-06-03 DIAGNOSIS — G4733 Obstructive sleep apnea (adult) (pediatric): Secondary | ICD-10-CM | POA: Diagnosis not present

## 2020-06-07 ENCOUNTER — Other Ambulatory Visit: Payer: Self-pay | Admitting: Cardiovascular Disease

## 2020-06-07 NOTE — Telephone Encounter (Signed)
   *  STAT* If patient is at the pharmacy, call can be transferred to refill team.   1. Which medications need to be refilled? (please list name of each medication and dose if known) rosuvastatin (CRESTOR) 20 MG tablet  2. Which pharmacy/location (including street and city if local pharmacy) is medication to be sent to?[facility] ([place]) - [place], [place] - [address]  3. Do they need a 30 day or 90 day supply? 90 days

## 2020-06-08 MED ORDER — ROSUVASTATIN CALCIUM 20 MG PO TABS: ORAL_TABLET | ORAL | 1 refills | Status: DC

## 2020-06-08 NOTE — Telephone Encounter (Signed)
Refill for Rosuvastatin sent to pharmacy

## 2020-06-09 ENCOUNTER — Other Ambulatory Visit: Payer: Self-pay

## 2020-06-09 ENCOUNTER — Encounter: Payer: Self-pay | Admitting: *Deleted

## 2020-06-09 ENCOUNTER — Other Ambulatory Visit: Payer: Self-pay | Admitting: *Deleted

## 2020-06-10 NOTE — Patient Instructions (Signed)
[name], it was a pleasure meeting you and your husband!  Keep up the great work staying free from falls and review the strategies we discussed below; try to gradually start incorporating exercises into your weekly routine so you can become stronger which will help with fall prevention.  Try to start practicing NOT RUSHING with activities.  Write down any strategies for fall prevention that you come up with in the coming weeks so we can talk about them during future visits.  I have scheduled my next visit with you as a phone call on Thursday July 14, 2020 between 3-4 pm- be listening out for me.  I look forward to talking to you for update on your progress then.   CLIENT/RN ACTION PLAN - FALL PREVENTION  Registered Nurse:  Ricke Hey, RN Date: June 09, 2020  Client Name: [name] Client ID:  [id number]   Target Area:  FALL PREVENTION    Why Problem May Occur: Caring for your husband, who has established risk of falls Ongoing occasional chronic knee pain Steep steps in and out of home Step-in bath tub used for assisting your husband with bathing/ hygiene   Target Goal: To avoid falls and avoid the use of assistive devices in the future   STRATEGIES Coping Strategies Ideas  Change Positions Slowly: lying to sitting, sitting to standing . Changing positions can make people lightheaded. . When getting up in the morning sit for a few minutes, before standing.  . Stand for a few minutes before walking and hold on to sturdy furniture or countertop.  . Don't rush- practice visualizing activities before you actually start doing the activity . Continue practicing maintaining good posture when you walk; look forward, not down at the ground  Drink water . Dehydration can make people dizzy. . Ask your healthcare provider how much water you can drink. . Decrease caffeine, caffeine makes you urinate a lot, which can make you dehydrated. .   If you fall, tell someone . Tell a friend or  family member even if you didn't get hurt. . Tell your healthcare provider if you fall.  They can help you figure out why. .   Get your vision and hearing checked . Poor vision and hearing loss can make people fall. . Glaucoma and diabetes can cause poor vision. .   Other .    Prevention Ideas  Review your Medicines . Many medicines can make you dizzy or sleepy and increase your risk of falling. . Your Aging Gracefully Nurse will look at your medications and see if you are taking any that might cause falling. . Continue using your topical creams for knee pain  Activity and Exercise . Aging Gracefully exercises . Walking (inside or outside) . Dancing . Gardening . Housework:  cooking, Education officer, environmental, Pharmacologist . Exercise while watching TV . Swimming or water aerobics. . Start any exercises slowly and gradually work to increase as time goes on; don't over-do and stop if you begin to experience pain or discomfort . Always be aware of potential fall risks based on weather conditions- wet or slick surfaces are significant fall risks . Continue always wearing good comfortable shoes with good soles  Strengthen Bones . Exercise makes your bones stronger. . Vitamin D and Calcium make your bones stronger.  Ask your Healthcare provider if you should be taking them. . Your body makes vitamin D from the sun.  Sit in the sun for 10 minutes every day (without sunscreen). .   Control  your urine  . Prevent constipation . Cut back on caffeinated drinks. . Don't wait to urinate-- go when you first feel an urge . If diabetic, control your blood sugar. . Ask your Aging Gracefully Nurse and Healthcare Provider  about urine control. .   Control your blood sugar (if you are diabetic) . High blood sugar can cause frequent urination, poor vision, and numbness in your feet, which can make you fall.   . Low blood sugar can cause confusion and dizziness. .   Other coping strategies 1. 2. 3. 4. 5.   PRACTICE It  is important to practice the strategies so we can determine if they will be effective in helping to reach the goal.    Follow these specific recommendations:  Review these strategies frequently and gradually start incorporating them into your daily routine DON'T RUSH- for anything Be thinking of other strategies that you find helpful in meeting your goals, so we can discuss these when we meet again      If strategy does not work the first time, try it again.     We may make some changes over the next few sessions.       [name], RN, BSN, [employer] [facility] Care Management  [phone]

## 2020-06-10 NOTE — Patient Outreach (Signed)
Aging Physiological scientist Visit # 1  06/10/2020  ARDELIA WREDE 1951-04-07 [id number]  Visit:   RN Visit # 1- in person at patient's home [place]-virus screening tool completed by phone prior to visit; no concerns identified as a result of screening  Start Time:   12:10 End Time:   13:45 Total Minutes:   95  Readiness To Change Score:  Readiness to Change Score: 8.67  Universal RN Interventions: Calendar Distribution: Yes (provided to client and instructed in use/ purpose) Exercise Review: Yes (Reviewed with client by demonstration/ verbal instruction) Medications: Yes (Medication review completed with client) Medication Changes: No Mood: Yes (Reviewed with patient) Pain: Yes (Reviewed with patient) PCP Advocacy/Support: Yes (Reviewed with client) Fall Prevention: Yes (Reviewed with client) Incontinence: Yes (Reviewed with client)  Healthcare Provider Communication: Did Surveyor, mining With Client's Healthcare Provider?: No  Clinician View of Client Situation: Clinician View Of Client Situation: [name] is independent in all of her care and is the primary caregiver for her husband, who is a fall hazard and requires assistance with all ADL's.  She is very motivated to be as healthy and safe in their home as possible.  Client has steep steps entering into her home from both entryways and it is difficult for her to manage the steps, especially as she assists her husband with his walker.  It is difficult for [name] to assist in her husband's bathing due to the current layout of their bathroom.  She experiences periodic knee pain which worries her that she may one day incur a fall.  Their home is clean and clutter free.  [name] would like to avoid having to use assisitve devices in the future.   Client's View of His/Her Situation: Client View Of His/Her Situation: "I am doing good overall and want to stay that way so I can continue taking care of my husband; I want [place] both to stay safe and  healthy at home.  The last thing I need is to have a fall myself."  Medication Assessment: Do You Have Any Problems Paying For Medications?: No Where Does Client Store Medications?: Cabinet Can Client Read Pill Bottles?: Yes Does Client Use A Pillbox?: No Does Anyone Assist Client In Taking Medications?: No Do You Take Vitamin D?: Yes Total Number Of Medications That The Client Takes: 6 Does Client Have Any Questions Or Concerns About Medictions?: No Is Client Complaining Of Any Symptoms That Could Be Side Effects To Medications?: No Any Possible Changes In Medication Regimen?: No  OT Update: Will update Aging Gracefully team on successful completion of RN Visit # 1  Session Summary: Pleasant in-person RN Visit # 1; client is very motivated to actively participate in Aging Gracefully program; discussed importance of maintaining contact with Aging Gracefully team throughout duration of program.  Client would like to focus on target area of fall prevention with a goal of avoiding having to use assistive devices for ambulation in the future; CCAP assessment completed and interventions for fall prevention were initiated.  I appreciate the opportunity to work with diane in the Aging Gracefully program,   [name], Charity fundraiser, BSN, [employer] [facility] Care Management  [phone]

## 2020-06-19 DIAGNOSIS — G4733 Obstructive sleep apnea (adult) (pediatric): Secondary | ICD-10-CM | POA: Diagnosis not present

## 2020-06-20 ENCOUNTER — Other Ambulatory Visit: Payer: Self-pay | Admitting: Occupational Therapy

## 2020-06-20 ENCOUNTER — Other Ambulatory Visit: Payer: Self-pay

## 2020-06-20 NOTE — Patient Outreach (Signed)
Aging Gracefully Program  OT Follow-Up Visit  06/20/2020  [name] 02/21/1951 [id number]  Visit:  2- Second Visit  Start Time:  1200 End Time:  1230 Total Minutes:  30          Readiness to Change Score :  Readiness to Change Score: 8.67     Durable Medical Equipment: Adaptive Equipment: Long Handled Shoehorn, Sports administrator, Sock Aid Adaptive Equipment Distribution Date: 06/20/20  Patient Education: Education Provided: Yes Education Details: How to Get Up From a Fall Person(s) Educated: Patient Comprehension: Verbalized Understanding  Goals:  Goals Addressed            This Visit's Progress   . COMPLETED: Patient Stated       Pt will improve ability to perform LB dressing tasks safely and independently using appropriate AE.   ACTION PLANNING - DRESSING Target Problem Area: LB dressing tasks  Why Problem May Occur:  -difficulty reaching feet -No AE for assistance -limited LB mobility due to knee pain and buckling    Target Goal: To improve safety and independence in LB dressing using AE.   STRATEGIES Saving Your Energy: DO: DON'T:  Sit down to do tasks - a lightweight chair can be kept in the bathroom Stand too long while dressing  Use appropriate adaptive equipment:  long handled shoe horn, sock aide, dressing stick, reacher   Keep all items you'll need within easy reach   Other   Other     Simplifying the way you set up tasks or daily routines: DO: DON'T:  Wear sturdy shoes that grip the floor   Gather all items before getting started   Wear clothing that is easily manipulated Wear loose, flowing clothes  Other   Other        PRACTICE It is important to practice the strategies so we can determine if they will be effective in helping to reach your goal. Follow these specific recommendations: 1. Use AE-shoe shorn, sock aide, reacher 2.   Practice use of AE so that it will be time efficient 3.   Sit for dressing and give yourself plenty  of time so that you do not need to rush  If a strategy does not work the first time, try it again and again (and maybe again). We may make some changes over the next few sessions, based on how they work.   Ezra Sites, OTR/L  [phone]       Post Clinical Reasoning: Client Action (Goal) One Interventions: To improve safety and independence in LB dressing tasks. Did Client Try?: Yes Targeted Problem Area Status: A Little Better  Clinician View Of Client Situation:: Pt is excited about her AE for assistance with LB dressing tasks. OT demonstration use of reacher, sock aid, and shoe horn for LB dressing.  Client View Of His/Her Situation:: Pt is motivated to learn and practice sufficient use for improved ease of LB dressing. Pt somewhat discouraged by discovering leaking sink faucet and subsequent water damage, as well as refridgerator disrepair with potential cost of new refridgerator. Discussed situation with pt and provided psychosocial support which pt verbalized appreciation for with improved outlook at end of visit.  Next Visit Plan:: Provide bedrail for getting into/out of bed, assess couch risers if finished by [facility], OTR/L  [phone] 06/20/20

## 2020-06-22 ENCOUNTER — Other Ambulatory Visit: Payer: Self-pay | Admitting: Cardiovascular Disease

## 2020-06-22 NOTE — Telephone Encounter (Signed)
Rx has been sent to the pharmacy electronically. ° °

## 2020-06-27 DIAGNOSIS — G4733 Obstructive sleep apnea (adult) (pediatric): Secondary | ICD-10-CM | POA: Diagnosis not present

## 2020-07-01 ENCOUNTER — Other Ambulatory Visit: Payer: Self-pay | Admitting: Cardiovascular Disease

## 2020-07-14 ENCOUNTER — Other Ambulatory Visit: Payer: Self-pay | Admitting: *Deleted

## 2020-07-14 ENCOUNTER — Encounter: Payer: Self-pay | Admitting: *Deleted

## 2020-07-14 NOTE — Patient Outreach (Signed)
Aging Physiological scientist Visit # 2  07/14/2020  [name] 24-Aug-1951 [id number]  Visit:   Aging Gracefully RN Visit # 2- by phone  Start Time:   3:30 pm End Time:   4:00 pm Total Minutes:   30  Universal RN Interventions: Calendar Distribution: Yes (Reviewed with client today) Exercise Review: Yes (Reviewed with client today) Pain: Yes (Reviewed with client today) Fall Prevention: Yes (Reviewed with client today)  Healthcare Provider Communication: Did Surveyor, mining With [employer] Provider?: No  Clinician View of Client Situation: Clinician View Of Client Situation: [name] remains on track and motivated in meeting her previously established Aging Gracefully goals.  She reports that she has deliberately started giving herself more time to plan for tasks/ appointments, which she has found very helpful.  She continues managing her occasional knee pain and reports having had no falls since our last visit.  She is hopeful that home modifications will begin soon, as getting her husband in/ out of the house remains a significant challenge for her and for him.  She has obtained a new transport chair for her husband and is hopeful that this will make caregiving easier on both herself and her husband.  Client's View of His/Her Situation: Client View Of His/Her Situation: "Things are holding steady and I am doing well; I am still providing care for my husband, and it is challenging, but the best thing I have started doing is practicing not rushing-- it is making everything better, and I am adjusting to the new mind set-- I believe once I get used to it, it will become second-nature.  I have not been doing as much of the exercises as I would like, but I plan to try to do them more consistently in the coming weeks.  I have not had any falls and we were even able to take our Labor day trip with our family.  I am hoping the home modification teams will get started soon on the work they'll  be doing."  OT Update: Will update Aging Gracefully team on successful completion of RN Visit # 2 by phone today; client is on track to meet her previously established Aging Gracefully goals  Session Summary:  Pleasant Aging Gracefully RN visit # 2 by phone.  Client on track in meeting previously established goals around fall prevention and has not experienced any recent falls.  She has taken steps to obtain a transport chair for her husband to make caregiving easier for both herself and for her husband.  She reports a big difference in her outlook since she has started practicing techniques to avoid rushing and reports that both she and her husband seem more calm since doing so.  Good brainstorming session today.  She is curious around the timeline of when the home modifications team will begin their work- the [facility] phone number was provided to client. Today, we scheduled our third visit together for August 16, 2020, which will be an in-person visit at client's home.  Kylie Pina, RN, BSN, [employer] [facility] Care Management  [phone]

## 2020-07-15 NOTE — Patient Instructions (Signed)
It was nice talking with you this afternoon, [name]!  Great job learning to slow down, not rush and take your time with tasks-- keep up the great work!  I'm glad that you have obtained a transport chair for your husband-- I am hoping this will make caring for him easier on both of you and look forward to an update the next time we talk.  If possible, keep trying to incorporate exercise into your weekly routine  If you need to contact the [employer] team, the phone number is: [phone]  I have scheduled our next visit for Tuesday August 16, 2020; I will be coming to your home between 12:30 pm-1:00 pm-- I look forward to seeing you then!  CLIENT/RN ACTION PLAN - FALL PREVENTION  Registered Nurse:  Ricke Hey RN Date:July 14, 2020  Client Name: [name] Client ID: [id number]   Target Area:  FALL PREVENTION    Why Problem May Occur: Caring for your husband, who has established risk of falls Ongoing occasional chronic knee pain Steep steps in and out of home Step-in bath tub used for assisting your husband with bathing/ hygiene   Target Goal: To avoid falls and avoid the use of assistive devices in the future   STRATEGIES Coping Strategies Ideas  Change Positions Slowly: lying to sitting, sitting to standing . Changing positions can make people lightheaded. . When getting up in the morning sit for a few minutes, before standing.  . Stand for a few minutes before walking and hold on to sturdy furniture or countertop.  . Don't rush- practice visualizing activities before you actually start doing the activity . Continue practicing maintaining good posture when you walk; look forward, not down at the ground  Drink water . Dehydration can make people dizzy. . Ask your healthcare provider how much water you can drink. . Decrease caffeine, caffeine makes you urinate a lot, which can make you dehydrated. .   If you fall, tell someone . Tell a friend or  family member even if you didn't get hurt. . Tell your healthcare provider if you fall.  They can help you figure out why. .   Get your vision and hearing checked . Poor vision and hearing loss can make people fall. . Glaucoma and diabetes can cause poor vision. .   Other .    Prevention Ideas  Review your Medicines . Many medicines can make you dizzy or sleepy and increase your risk of falling. . Your Aging Gracefully Nurse will look at your medications and see if you are taking any that might cause falling. . Continue using your topical creams for knee pain  Activity and Exercise . Aging Gracefully exercises . Walking (inside or outside) . Dancing . Gardening . Housework:  cooking, Education officer, environmental, Pharmacologist . Exercise while watching TV . Swimming or water aerobics. . Start any exercises slowly and gradually work to increase as time goes on; don't over-do and stop if you begin to experience pain or discomfort . Always be aware of potential fall risks based on weather conditions- wet or slick surfaces are significant fall risks . Continue always wearing good comfortable shoes with good soles  Strengthen Bones . Exercise makes your bones stronger. . Vitamin D and Calcium make your bones stronger.  Ask your Healthcare provider if you should be taking them. . Your body makes vitamin D from the sun.  Sit in the sun for 10 minutes every day (without sunscreen). .   Control  your urine  . Prevent constipation . Cut back on caffeinated drinks. . Don't wait to urinate-- go when you first feel an urge . If diabetic, control your blood sugar. . Ask your Aging Gracefully Nurse and Healthcare Provider  about urine control. .   Control your blood sugar (if you are diabetic) . High blood sugar can cause frequent urination, poor vision, and numbness in your feet, which can make you fall.   . Low blood sugar can cause confusion and dizziness. .   Other coping strategies 1. 2. 3. 4. 5.   PRACTICE It  is important to practice the strategies so we can determine if they will be effective in helping to reach the goal.    Follow these specific recommendations:  July 14, 2020:  Great job learning to slow down, not rush and take your time with tasks-- keep up the great work!  I'm glad that you have obtained a transport chair for your husband-- I am hoping this will make caring for him easier on both of you and look forward to an update the next time we talk.  If possible, keep trying to incorporate exercise into your weekly routine  -----------------------------------------------------------------------------------------------  Review these strategies frequently and gradually start incorporating them into your daily routine DON'T RUSH- for anything Be thinking of other strategies that you find helpful in meeting your goals, so we can discuss these when we meet again      If strategy does not work the first time, try it again.     We may make some changes over the next few sessions.       [name], RN, BSN, [employer] [facility] Care Management  [phone]

## 2020-07-18 ENCOUNTER — Other Ambulatory Visit: Payer: Self-pay | Admitting: Occupational Therapy

## 2020-07-19 ENCOUNTER — Other Ambulatory Visit: Payer: Self-pay

## 2020-07-19 NOTE — Patient Outreach (Signed)
Aging Gracefully Program  OT Follow-Up Visit  07/19/2020  JANALYNN EDER 1951/09/22 [id number]  Visit:  3- Third Visit  Start Time:  1203 End Time:  1305 Total Minutes:  62      Readiness to Change Score :  Readiness to Change Score: 9  Home Environment Assessment:    Durable Medical Equipment: Adaptive Equipment: Bed Rail, Long Handled Sponge Adaptive Equipment Distribution Date: 07/18/20  Patient Education: Education Provided: Yes Education Details: Bed rail use, long-handled sponge use Person(s) Educated: Patient, Spouse Comprehension: Verbalized Understanding, Returned Demonstration  Goals:  Goals Addressed            This Visit's Progress   . Patient Stated       Pt will improve ability to get into and out of the shower/tub and perform LB bathing tasks safely and independently.   ACTION PLANNING - BATHING Target Problem Area: Performing bathing tasks  Why Problem May Occur:  -Difficulty to reach legs, feet, back -Decreased mobility -Fear of falling    Target Goal: To perform bathing tasks safely and independently   STRATEGIES Saving Your Energy: DO: DON'T:  Use a tub bench/seat Stand while bathing, it uses more energy  Use appropriate adaptive equipment:  long handled sponge, soap on a rope Rush  Keep all items you'll need within easy reach   Other   Other    Modifying your home environment and making it safe: DO: DON'T:  Install grab bars n the shower and next to the toilet   Place a rubber mat along the entire length of the tub Place loose rugs in the bathroom- they can trip you or your walker/cane can get caught on them  Make sure the bathroom is well lit    Install a hand held shower head    Simplifying the way you set up tasks or daily routines: DO: DON'T:  Plan to bathe/shower before you're overly tired Rush through [employer] all items before getting started   Other   Other   Other    PRACTICE It is important  to practice the strategies so we can determine if they will be effective in helping to reach your goal. Follow these specific recommendations: 1. Use the long-handles sponge provided 2.   Bathe when you have good energy 3.  Sit for bathing tasks whenever possible  If a strategy does not work the first time, try it again and again (and maybe again). We may make some changes over the next few sessions, based on how they work.   Ezra Sites, OTR/L     . Patient Stated       Pt will improve ability to get up from the couch and commode, and out of bed safely and independently.   ACTION PLANNING - FUNCTIONAL MOBILITY Target Problem Area: Getting into/out of the bed  Why Problem May Occur:  -bed is high -no railings or grab bars -decreased physical mobility    Target Goal: To get into and out of the bed safely and independently   STRATEGIES Saving Your Energy: DO: DON'T:  Take breaks    Raise the height of surfaces       Remove tripping hazards     Other    Modifying your home environment and making it safe: DO: DON'T:  Install bed railing Hold onto unsafe surfaces-chairs, etc.  Remove or strongly secure throw rugs    Provide adequate lighting Use dim lights or lights that cast a lot  of shadows  Other   Other    Simplifying the way you set up tasks or daily routines: DO: DON'T:  Move slowly Rush during transfers or walking   Other   Other    Practice It is important to practice the strategies so we can determine if they will be effective in helping to reach your goal. Follow these specific recommendations: 1. Use bed rail 2.   Take your time 3.   Make sure a lamp is on so you can see well  If a strategy does not work the first time, try it again and again (and maybe again). We may make some changes over the next few sessions, based on how they work.   Ezra Sites, OTR/L            Post Clinical Reasoning: Client Action (Goal) One Interventions: To  improve ability to get into and out of the bed. Did Client Try?: Yes Targeted Problem Area Status: A Lot Better  Client Action (Goal) Two Interventions: To improve ability to perform bathing tasks independently. Did Client Try?: Yes Targeted Problem Area Status: A Lot Better  Clinician View Of Client Situation:: Pt is excited about success of using AE provided last session-shoe horn and sock aid. Pt seems excited about upcoming home modifications, does have questions regarding [facility] process and new construction team members. Client is proactive in use of strategies learned during OT visits.  Client View Of His/Her Situation:: Pt is motivated to try strategies after having success with AE and strategies learned at previous session. Pt continues to be discouraged by leaking sink faucet but is thankful her refrigerator was repaired and she did not have to purchase a new one. Discussed current changes with [facility] and upcoming appointments and provided psychosocial support as needed.  Next Visit Plan:: Assess shower installation and determine if new shower seat and grippers are needed. Assess couch risers if completed   Ezra Sites, OTR/L

## 2020-07-20 DIAGNOSIS — G4733 Obstructive sleep apnea (adult) (pediatric): Secondary | ICD-10-CM | POA: Diagnosis not present

## 2020-08-11 ENCOUNTER — Other Ambulatory Visit: Payer: Self-pay | Admitting: Occupational Therapy

## 2020-08-11 ENCOUNTER — Other Ambulatory Visit: Payer: Self-pay

## 2020-08-11 NOTE — Patient Outreach (Signed)
Aging Gracefully Program  OT Follow-Up Visit  08/11/2020  [name] 1950-11-18 [id number]  Visit:  4- Fourth Visit  Start Time:  0900 End Time:  0945 Total Minutes:  45   Readiness to Change Score :  Readiness to Change Score: 9     Patient Education: Education Provided: Yes Education Details: ramp use Person(s) Educated: Patient Comprehension: Verbalized Understanding  Goals:  Goals Addressed            This Visit's Progress   . COMPLETED: Patient Stated       Pt will improve ability to get into and out of the house safely and independently.   ACTION PLANNING - FUNCTIONAL MOBILITY Target Problem Area: Getting in/out of the house  Why Problem May Occur: -steep steps -fear of falling -difficulty physically supporting husband as he tries to manage steps     Target Goal: To improve independence and safety getting into and out of the house   STRATEGIES Saving Your Energy: DO: DON'T:  Take breaks    Raise the height of surfaces    Take frequent rests. Just taking 15 minutes in a comfortable chair before becoming fatigued may help to restore your energy   Remove tripping hazards     Use the ramp Attempt the steps    Simplifying the way you set up tasks or daily routines: DO: DON'T:  Move slowly Rush during transfers or walking          Practice It is important to practice the strategies so we can determine if they will be effective in helping to reach your goal. Follow these specific recommendations: 1. Use the ramp for entry and exit 2.   Don't rush, give yourself plenty of time for getting to and from the car into/out of the house  If a strategy does not work the first time, try it again and again (and maybe again). We may make some changes over the next few sessions, based on how they work.   Ezra Sites, OTR/L  [phone]           Post Clinical Reasoning: Client Action (Goal) One Interventions: To improve ability to get in  and out of the house. Did Client Try?: Yes Targeted Problem Area Status: A Little Better  Clinician View Of Client Situation:: Pt is excited about the ramp and the freedom it gives her and her husband for getting in and out of the house. She no longer has to support her husband attempting to get up and down steep steps. Pt is looking forward to her shower conversion and ability to bath without fear of falling over the lip of the tub.  Client View Of His/Her Situation:: Pt is motivated to use the ramp and has ideas for simply walking up and down the ramp for exercise. Pt feels a sense of security with the ramp installation.  Next Visit Plan:: Assess shower installation and determine if new shower seat and gripper are needed. Trial seat lifter

## 2020-08-16 ENCOUNTER — Other Ambulatory Visit: Payer: Self-pay

## 2020-08-16 ENCOUNTER — Other Ambulatory Visit: Payer: Self-pay | Admitting: *Deleted

## 2020-08-16 ENCOUNTER — Encounter: Payer: Self-pay | Admitting: *Deleted

## 2020-08-16 NOTE — Patient Outreach (Signed)
Aging Gracefully Program  RN Visit # 3  08/16/2020  [name] 1951-07-30 [id number]  Visit:   Aging Gracefully RN Visit # 3  Start Time:   12:50 pm End Time:   1:30 pm Total Minutes:   40 minutes  Universal RN Interventions: Calendar Distribution: Yes (reviewed with client today) Exercise Review: Yes (reviewed with client today) Fall Prevention: Yes (reviewed with client today)  Healthcare Provider Communication: Did Surveyor, mining With [employer] Provider?: No  Clinician View of Client Situation: Diplomatic Services operational officer Of Client Situation: [name] remains on track in meeting her previously established Aging Gracefully goals around fall prevention; she has not incurred any new/ recent falls, however, she reports that her husband took a soft fall yesterday and slid to a sitting position on the floor; she was able to get him up safely, but acknowledges that it was difficult.  She has not incorporated the Aging Gracefully exercises into her routine, but is finding the newly installed ramp and sidewalk very helpful in managing her husband's needs/ ambulation as his primary caregiver; she verbalizes possible plans to use the ramp and sidewalk to get some exercises outside on nice weather days.  She is motivated to developing a plan to follow in case she or her husband experience a fall.  She has found previously discussed interventions by Aging Gracefully team worthwhile and helpful and is looking forward to having her shower conversion and sink faucet replaced.   Client's View of His/Her Situation: Client View Of His/Her Situation: "I am so enjoying the new ramp and sidewalk they have installed-- it is making everything easier for me and for my husband.  They should be starting work on the shower any time now this week.  Unfortunately, my husband slid down and took a soft fall yesterday.  It was hard for me to help him get up, ut eventually I was able to; he was not hurt and I didn't get hurt  either-- but it really makes me realize how much there needs to be a plan in place just in case something does happen. I am thinking about getting a wagon system to help me when I need to get the groceries up to the house from the car.  I have not been doing the exercises, but I have been staying active.  I am glad that I have started to learn not to rush and I think that has been very helpful as I go about my daily tasks."  Medication Assessment: reports no recent changes to medications   OT Update: Will update Aging Gracefully team around successful completion of RN Visit # 3  Session Summary: [name] is on track to meeting her previously established Aging Gracefully goals around fall prevention, and today we brainstormed around an action plan should a fall actually occur; client is very motivated and remains able to come up with good solutions and possible interventions to employ should she or her husband experience another fall.  Pleasant in-home visit.  Kylie Pina, RN, BSN, [employer] [facility] Care Management  [phone]

## 2020-08-16 NOTE — Patient Instructions (Signed)
[name], it was so nice visiting with you and your husband today.  You are making great progress in meeting your goals around fall prevention- it is great that you are thinking about an action plan to take if you or your husband should fall again- always remember to take the safest action so that no further injury occurs if you or your husband should fall.  I have included some printed educational material for you to review and think about; I have also asked the aging Gracefully Occupational Therapist to be thinking about possible interventions to take in case a fall does occur.  I have scheduled our next (and final) Aging Gracefully visit as a phone call on Monday September 12, 2020 between 3-4 pm-- please be listening out for my phone call-- I look forward to hearing about your progress and the next home modifications!  Our updates from today's visit are below, bolded in red text  CLIENT/RN ACTION PLAN - FALL PREVENTION  Registered Nurse:  Ricke Hey RN Date:July 14, 2020  Client Name: [name] Client ID: [id number]   Target Area:  FALL PREVENTION    Why Problem May Occur: Caring for your husband, who has established risk of falls Ongoing occasional chronic knee pain Steep steps in and out of home Step-in bath tub used for assisting your husband with bathing/ hygiene   Target Goal: To avoid falls and avoid the use of assistive devices in the future   STRATEGIES Coping Strategies Ideas  Change Positions Slowly: lying to sitting, sitting to standing . Changing positions can make people lightheaded. . When getting up in the morning sit for a few minutes, before standing.  . Stand for a few minutes before walking and hold on to sturdy furniture or countertop.  . Don't rush- practice visualizing activities before you actually start doing the activity . Continue practicing maintaining good posture when you walk; look forward, not down at the ground  Drink water . Dehydration  can make people dizzy. . Ask your healthcare provider how much water you can drink. . Decrease caffeine, caffeine makes you urinate a lot, which can make you dehydrated. .   If you fall, tell someone . Tell a friend or family member even if you didn't get hurt. . Tell your healthcare provider if you fall.  They can help you figure out why. . Develop a plan of action to follow if you or your husband experience a fall: think about calling EMS to help to get you or your husband up; think about family or neighbors who you might be able to call to help.  Don't risk further injury to either of you if you are not sure you can safely get up without help.  Always take the safest action to ensure no one gets injured from a fall.  Keep your cell phone and your husband's walker nearby at all times.  If you do fall and think you or your husband may be injured, let your care providers know right away- some injuries may take awhile to show up  Get your vision and hearing checked . Poor vision and hearing loss can make people fall. . Glaucoma and diabetes can cause poor vision. .   Other .    Prevention Ideas  Review your Medicines . Many medicines can make you dizzy or sleepy and increase your risk of falling. . Your Aging Gracefully Nurse will look at your medications and see if you are taking any that might  cause falling. . Continue using your topical creams for knee pain  Activity and Exercise . Aging Gracefully exercises- if possible, try to start doing these simple exercises, which are easy to do while standing or sitting . Walking (inside or outside)- use your new ramp and sidewalk to get some good walking/ stretching in on days that the weather is nice . Dancing . Gardening -your garden areas are a nice way to get some good activity in: be sure to dress appropriately for weather and to wear sturdy shoes with good soles . Housework:  cooking, Education officer, environmental, Pharmacologist . Exercise while watching TV . Swimming or  water aerobics. . Start any exercises slowly and gradually work to increase as time goes on; don't over-do and stop if you begin to experience pain or discomfort . Always be aware of potential fall risks based on weather conditions- wet or slick surfaces are significant fall risks . Continue always wearing good comfortable shoes with good soles  Strengthen Bones . Exercise makes your bones stronger. . Vitamin D and Calcium make your bones stronger.  Ask your Healthcare provider if you should be taking them. . Your body makes vitamin D from the sun.  Sit in the sun for 10 minutes every day (without sunscreen). .   Control your urine  . Prevent constipation . Cut back on caffeinated drinks. . Don't wait to urinate-- go when you first feel an urge . If diabetic, control your blood sugar. . Ask your Aging Gracefully Nurse and Healthcare Provider  about urine control. .   Control your blood sugar (if you are diabetic) . High blood sugar can cause frequent urination, poor vision, and numbness in your feet, which can make you fall.   . Low blood sugar can cause confusion and dizziness. .   Other coping strategies 1. 2. 3. 4. 5.   PRACTICE It is important to practice the strategies so we can determine if they will be effective in helping to reach the goal.    Follow these specific recommendations:   August 16, 2020: -- develop a plan of action to take in case you or your husband fall -- use the new ramp and sidewalk for simple walking/ exercising/ stretching -- obtain a nice wagon or tote on wheels to assist with getting groceries in from the car to your home -- I have let the Occupational Therapist know that your husband took a soft fall recently so she can be thinking of options that are appropriate to add to your action plan in case someone does fall   July 14, 2020:  Great job learning to slow down, not rush and take your time with tasks-- keep up the great work!  I'm glad that  you have obtained a transport chair for your husband-- I am hoping this will make caring for him easier on both of you and look forward to an update the next time we talk.  If possible, keep trying to incorporate exercise into your weekly routine  -----------------------------------------------------------------------------------------------  Review these strategies frequently and gradually start incorporating them into your daily routine DON'T RUSH- for anything Be thinking of other strategies that you find helpful in meeting your goals, so we can discuss these when we meet again      If strategy does not work the first time, try it again.     We may make some changes over the next few sessions.       [name], RN, BSN, CCRN  [facility]  [phone]

## 2020-08-19 DIAGNOSIS — G4733 Obstructive sleep apnea (adult) (pediatric): Secondary | ICD-10-CM | POA: Diagnosis not present

## 2020-08-24 DIAGNOSIS — M79604 Pain in right leg: Secondary | ICD-10-CM | POA: Diagnosis not present

## 2020-08-24 DIAGNOSIS — Z6841 Body Mass Index (BMI) 40.0 and over, adult: Secondary | ICD-10-CM | POA: Diagnosis not present

## 2020-08-24 DIAGNOSIS — M329 Systemic lupus erythematosus, unspecified: Secondary | ICD-10-CM | POA: Diagnosis not present

## 2020-08-24 DIAGNOSIS — R899 Unspecified abnormal finding in specimens from other organs, systems and tissues: Secondary | ICD-10-CM | POA: Diagnosis not present

## 2020-08-24 DIAGNOSIS — M170 Bilateral primary osteoarthritis of knee: Secondary | ICD-10-CM | POA: Diagnosis not present

## 2020-08-24 DIAGNOSIS — M65331 Trigger finger, right middle finger: Secondary | ICD-10-CM | POA: Diagnosis not present

## 2020-09-05 ENCOUNTER — Encounter: Payer: Self-pay | Admitting: Occupational Therapy

## 2020-09-12 ENCOUNTER — Encounter: Payer: Self-pay | Admitting: *Deleted

## 2020-09-12 ENCOUNTER — Other Ambulatory Visit: Payer: Self-pay | Admitting: *Deleted

## 2020-09-12 ENCOUNTER — Other Ambulatory Visit: Payer: Self-pay | Admitting: Occupational Therapy

## 2020-09-12 ENCOUNTER — Other Ambulatory Visit: Payer: Self-pay

## 2020-09-12 NOTE — Patient Outreach (Signed)
Aging Gracefully Program  OT Follow-Up Visit  09/12/2020  [name] 1951/07/18 [id number]  Visit:  5- Fifth Visit  Start Time:  0945 End Time:  1026 Total Minutes:  41        Readiness to Change Score :  Readiness to Change Score: 9.33    Durable Medical Equipment: Adaptive Equipment: Other (shower curtain) Adaptive Equipment Distribution Date: 09/12/20  Patient Education: Education Provided: Yes Education Details: shower curtain adaptation for shower seat Person(s) Educated: Patient Comprehension: Verbalized Understanding  Goals:  Goals Addressed            This Visit's Progress   . COMPLETED: Patient Stated       Pt will improve ability to get into and out of the shower/tub and perform LB bathing tasks safely and independently.   ACTION PLANNING - BATHING Target Problem Area: Performing bathing tasks  Why Problem May Occur:  -Difficulty to reach legs, feet, back -Decreased mobility -Fear of falling    Target Goal: To perform bathing tasks safely and independently   STRATEGIES Saving Your Energy: DO: DON'T:  Use a tub bench/seat Stand while bathing, it uses more energy  Use appropriate adaptive equipment:  long handled sponge, soap on a rope Rush  Keep all items you'll need within easy reach   Other   Other    Modifying your home environment and making it safe: DO: DON'T:  Install grab bars n the shower and next to the toilet   Place a rubber mat along the entire length of the tub Place loose rugs in the bathroom- they can trip you or your walker/cane can get caught on them  Make sure the bathroom is well lit    Install a hand held shower head    Simplifying the way you set up tasks or daily routines: DO: DON'T:  Plan to bathe/shower before you're overly tired Rush through [employer] all items before getting started   Other   Other   Other    PRACTICE It is important to practice the strategies so we can determine if  they will be effective in helping to reach your goal. Follow these specific recommendations: 1. Use the long-handles sponge provided 2.   Bathe when you have good energy 3.  Sit for bathing tasks whenever possible, tuck shower curtain over handle to decrease water leakage from tub.   If a strategy does not work the first time, try it again and again (and maybe again). We may make some changes over the next few sessions, based on how they work.   Ezra Sites, OTR/L        Post Clinical Reasoning: Client Action (Goal) One Interventions: To improve ability to perform bathing tasks independently. Did Client Try?: Yes Targeted Problem Area Status: A Little Better  Clinician View Of Client Situation:: Pt is excited about her new walk-in shower. Does have difficulty with the seat handle pushing the shower curtain out and water getting on the floor. Pt demonstrates understanding of shower curtain adaptation. OT installed additional shower curtain and cut a slit to go over the shower seat handle to prevent pushing out from the shower.  Client View Of His/Her Situation:: Pt is excited for using her walk-in shower safely and with the ability to help her husband bath without fear of falling when trying to get in and out of the tub.  Next Visit Plan:: Provide toilet riser for final goal, discharge pt from program

## 2020-09-12 NOTE — Patient Outreach (Signed)
Aging Psychiatrist Visit # 4- final/ by phone  09/12/2020  VESTER TITSWORTH February 23, 1951 [id number]  Visit:   Aging Gracefully RN Visit # 4 (final)- by phone  Start Time:   15:25 End Time:   16:05 Total Minutes:   40  Readiness To Change Score:  Readiness to Change Score: 10  Universal RN Interventions: Calendar Distribution: Yes (reviewed with client today) Exercise Review: Yes (reviewed with client today) Medications: Yes (reviewed with client today) Pain: Yes (reviewed with client today) PCP Advocacy/Support: Yes (reviewed with client today) Fall Prevention: Yes (reviewed with client today)  Healthcare Provider Communication: Did Higher education careers adviser With [employer] Provider?: No  Clinician View of Client Situation: Clinician View Of Client Situation: [name] has successfully met her previously established Aging Gracefully goals around fall prevention; she has not had any recent falls, however her husband has experienced several recent falls, all without injury.  We discussed developing a plan of action should this occur in the future.  [name] is not having to use any assisitive devices for ambulation and she is very grateful and verbalizes increased confidence in staying safe at home due to the aging gracefully program.   [employer] of His/Her Situation: Ambulance person Of His/Her Situation: "I am loving the changes that have been made to our home- the new walk-in shower is working out so well and making showering so much easier for myself and for my husband.  The OT came out to visit today and she is working on getting me a shower bench instead of a chair, because the chair we have makes water go on the floor.  She is also working on getting a bedside commode to go over the toilet to make going to the bathroom easier for my husband.  I have not had any recent falls, but my husband has had 2 more falls since the last time you were here-- he was not injured and I was able to get  him up with help from my neighbor.  I have started back doing some of the exercises you showed me, I try to do them a couple of times each week.  We are both much more confident in staying safe at home because of this wonderful program."  OT Update: Will update Aging Gracefully team on successful completion of RN Visit # 4 today by phone  Session Summary:  Pleasant final Aging Gracefully RN Visit with client; brainstorming was completed and patient has successfully accomplished her previously established Aging Gracefully goals around fall prevention.   It has been a pleasure working with diane to assist her in meeting her goals,  Oneta Rack, RN, BSN, [employer] Coordinator [facility]  [phone]

## 2020-09-12 NOTE — Patient Instructions (Addendum)
[name], it has been a pleasure working with you and assisting you in accomplishing your goals around fall prevention-- you have done a great job accomplishing the goals you set since we started the Aging gracefully program.  Keep working with the Occupational Therapist around obtaining a shower bench, a bedside commode for going over the toilet to make toilet-ing easier for [name]; and possibly obtaining a gait belt to help you get [name] up should he have another fall.  Try to keep incorporating the Aging Gracefully exercises into your weekly routine so that you stay strong and flexible.  Continue your efforts to develop an action plan to follow in case [name] has another fall.    Goals Addressed              This Visit's Progress   .  COMPLETED: Prevent falls (pt-stated)   On track     CLIENT/RN ACTION PLAN - FALL PREVENTION  Registered Nurse:  Ricke Hey RN Date: September 12, 2020- Final RN Visit  Client Name: [name] "[name]" Perlie Gold Client ID: [id number]   Target Area:  FALL PREVENTION    Why Problem May Occur: Caring for your husband, who has established risk of falls Ongoing occasional chronic knee pain Steep steps in and out of home Step-in bath tub used for assisting your husband with bathing/ hygiene   Target Goal: To avoid falls and avoid the use of assistive devices in the future   STRATEGIES Coping Strategies Ideas  Change Positions Slowly: lying to sitting, sitting to standing . Changing positions can make people lightheaded. . When getting up in the morning sit for a few minutes, before standing.  . Stand for a few minutes before walking and hold on to sturdy furniture or countertop.  . Don't rush- practice visualizing activities before you actually start doing the activity . Continue practicing maintaining good posture when you walk; look forward, not down at the ground  Drink water . Dehydration can make people dizzy. . Ask your healthcare provider how much water  you can drink. . Decrease caffeine, caffeine makes you urinate a lot, which can make you dehydrated. .   If you fall, tell someone . Tell a friend or family member even if you didn't get hurt. . Tell your healthcare provider if you fall.  They can help you figure out why. . Develop a plan of action to follow if you or your husband experience a fall: think about calling EMS to help to get you or your husband up; think about family or neighbors who you might be able to call to help.  Don't risk further injury to either of you if you are not sure you can safely get up without help.  Always take the safest action to ensure no one gets injured from a fall.  Keep your cell phone and your husband's walker nearby at all times.  If you do fall and think you or your husband may be injured, let your care providers know right away- some injuries may take awhile to show up  Get your vision and hearing checked . Poor vision and hearing loss can make people fall. . Glaucoma and diabetes can cause poor vision. .   Other .    Prevention Ideas  Review your Medicines . Many medicines can make you dizzy or sleepy and increase your risk of falling. . Your Aging Gracefully Nurse will look at your medications and see if you are taking any that might cause falling. [name] Kitchen  Continue using your topical creams for knee pain  Activity and Exercise . Aging Gracefully exercises- if possible, try to start doing these simple exercises, which are easy to do while standing or sitting . Walking (inside or outside)- use your new ramp and sidewalk to get some good walking/ stretching in on days that the weather is nice . Dancing . Gardening -your garden areas are a nice way to get some good activity in: be sure to dress appropriately for weather and to wear sturdy shoes with good soles . Housework:  cooking, Education officer, environmental, Pharmacologist . Exercise while watching TV . Swimming or water aerobics. . Start any exercises slowly and gradually work to  increase as time goes on; don't over-do and stop if you begin to experience pain or discomfort . Always be aware of potential fall risks based on weather conditions- wet or slick surfaces are significant fall risks . Continue always wearing good comfortable shoes with good soles  Strengthen Bones . Exercise makes your bones stronger. . Vitamin D and Calcium make your bones stronger.  Ask your Healthcare provider if you should be taking them. . Your body makes vitamin D from the sun.  Sit in the sun for 10 minutes every day (without sunscreen). .   Control your urine  . Prevent constipation . Cut back on caffeinated drinks. . Don't wait to urinate-- go when you first feel an urge . If diabetic, control your blood sugar. . Ask your Aging Gracefully Nurse and Healthcare Provider  about urine control. .   Control your blood sugar (if you are diabetic) . High blood sugar can cause frequent urination, poor vision, and numbness in your feet, which can make you fall.   . Low blood sugar can cause confusion and dizziness. .   Other coping strategies 1. 2. 3. 4. 5.   PRACTICE It is important to practice the strategies so we can determine if they will be effective in helping to reach the goal.    Follow these specific recommendations:   September 12, 2020:  [name], it has been a pleasure working with you and assisting you in accomplishing your goals around fall prevention-- you have done a great job accomplishing the goals you set since we started the Aging gracefully program.  Keep working with the Occupational Therapist around obtaining a shower bench, a bedside commode for going over the toilet to make toilet-ing easier for [place]; and possibly obtaining a gait belt to help you get [name] up should he have another fall.  Continue your efforts to develop an action plan to follow in case [name] has another fall.    August 16, 2020: -- develop a plan of action to take in case you or your  husband fall -- use the new ramp and sidewalk for simple walking/ exercising/ stretching -- obtain a nice wagon or tote on wheels to assist with getting groceries in from the car to your home -- I have let the Occupational Therapist know that your husband took a soft fall recently so she can be thinking of options that are appropriate to add to your action plan in case someone does fall   July 14, 2020:  Great job learning to slow down, not rush and take your time with tasks-- keep up the great work!  I'm glad that you have obtained a transport chair for your husband-- I am hoping this will make caring for him easier on both of you and look forward to an update  the next time we talk.  If possible, keep trying to incorporate exercise into your weekly routine  -----------------------------------------------------------------------------------------------  Review these strategies frequently and gradually start incorporating them into your daily routine DON'T RUSH- for anything Be thinking of other strategies that you find helpful in meeting your goals, so we can discuss these when we meet again      If strategy does not work the first time, try it again.     We may make some changes over the next few sessions.       [name], RN, BSN, [employer] [facility]  [phone]

## 2020-09-19 DIAGNOSIS — G4733 Obstructive sleep apnea (adult) (pediatric): Secondary | ICD-10-CM | POA: Diagnosis not present

## 2020-10-19 DIAGNOSIS — G4733 Obstructive sleep apnea (adult) (pediatric): Secondary | ICD-10-CM | POA: Diagnosis not present

## 2020-10-24 ENCOUNTER — Other Ambulatory Visit: Payer: Self-pay | Admitting: Occupational Therapy

## 2020-10-24 ENCOUNTER — Other Ambulatory Visit: Payer: Self-pay

## 2020-10-24 NOTE — Patient Outreach (Signed)
Aging Gracefully Program  OT FINAL Visit  10/24/2020  MANAMI TUTOR 06-28-1951 [id number]  Visit:  6- Sixth Visit  Start Time:  1000 End Time:  1039 Total Minutes:  39      Readiness to Change:  Readiness to Change Score: 9.33  Home Environment Assessment:    Durable Medical Equipment: Durable Medical Equipment: Bedside Commode Durable Medical Equipment Distribution Date: 10/24/20 Adaptive Equipment: Other (gait belt) Adaptive Equipment Distribution Date: 10/24/20  Patient Education: Education Provided: Yes Education Details: BSC use as toilet riser; instructions for gait belt usage Person(s) Educated: Patient Comprehension: Verbalized Understanding,Returned Demonstration  Goals: Goals Addressed            This Visit's Progress   . COMPLETED: Patient Stated       Pt will improve ability to get up from the commode and out of bed safely and independently.   ACTION PLANNING - FUNCTIONAL MOBILITY Target Problem Area: Getting into/out of the bed and up from commode  Why Problem May Occur:  -bed is high -no railings or grab bars -decreased physical mobility -no railing at commode    Target Goal: To get into and out of the bed and up from commode safely and independently   STRATEGIES Saving Your Energy: DO: DON'T:  Take breaks  Rush  Raise the height of surfaces       Remove tripping hazards     Other    Modifying your home environment and making it safe: DO: DON'T:  Install bed railing Hold onto unsafe surfaces-chairs, etc.  Remove or strongly secure throw rugs    Provide adequate lighting Use dim lights or lights that cast a lot of shadows  Use the toilet riser with handles    Other    Simplifying the way you set up tasks or daily routines: DO: DON'T:  Move slowly Rush during transfers or walking   Other   Other    Practice It is important to practice the strategies so we can determine if they will be effective in helping to reach your  goal. Follow these specific recommendations: 1. Use bed rail 2.   Take your time 3.   Use the toilet riser over the commode for safety  If a strategy does not work the first time, try it again and again (and maybe again). We may make some changes over the next few sessions, based on how they work.   Ezra Sites, OTR/L            Post Clinical Reasoning: Client Action (Goal) One Interventions: To improve ability to get up and down from the commode. Did Client Try?: Yes Targeted Problem Area Status: A Lot Better  Clinician View Of Client Situation:: Pt's husband required an ER visit and has been more agitated lately, pt notes side effects from new dementia medication as well. Discussed situation and provided psychosocial support and suggested calling MD to inform of obvious side effects, also encouraged pt to call on family as needed for help, especially if her safety is at risk. DME and AE will assist pt with safely manuevering husband as well as improve her ability to get up from commode with her decreased knee mobility.  Client View Of His/Her Situation:: Pt is feeling better about her situation this week compared to the past 2 weeks. She verbalizes that her family has encouraged her to call for help whenever she needs it. Pt feels that her new BSC and gait belt will greatly improve safety  with toileting as well as when assisting husband up and down from surfaces and steps.  Next Visit Plan:: N/A-discharge pt   Ezra Sites, OTR/L  [phone] 10/24/2020

## 2020-10-25 ENCOUNTER — Other Ambulatory Visit: Payer: Self-pay

## 2020-10-25 MED ORDER — ROSUVASTATIN CALCIUM 20 MG PO TABS: ORAL_TABLET | ORAL | 1 refills | Status: AC

## 2020-10-25 MED ORDER — ROSUVASTATIN CALCIUM 20 MG PO TABS: ORAL_TABLET | ORAL | 1 refills | Status: DC

## 2020-11-02 ENCOUNTER — Other Ambulatory Visit: Payer: Self-pay

## 2020-11-02 DIAGNOSIS — U071 COVID-19: Secondary | ICD-10-CM | POA: Diagnosis not present

## 2020-11-02 NOTE — Patient Outreach (Signed)
Aging Gracefully Program  11/02/2020  [name] 05/13/1951 [id number]   [facility] Evaluation Interviewer attempted to call patient on today regarding Aging Gracefully 5 month follow up. No answer from patient after multiple rings. Care Management Assistant left detailed voice message to return call.  Will attempt to call back within 1 week.  [name] Care Management Assistant [phone]

## 2020-11-14 ENCOUNTER — Other Ambulatory Visit: Payer: Self-pay

## 2020-11-14 NOTE — Patient Outreach (Signed)
Aging Gracefully Program  11/14/2020  TYLYNN BRANIFF 07-Jan-1951 [id number]   [facility] Evaluation Interviewer attempted to call patient on today regarding Aging Gracefully 5 month evaluation. No answer from patient after multiple rings. CMA left detailed message for patient.  Will attempt again in 1 week.  [name] Care Management Assistant [phone]

## 2020-11-16 ENCOUNTER — Other Ambulatory Visit: Payer: Self-pay

## 2020-11-16 DIAGNOSIS — I10 Essential (primary) hypertension: Secondary | ICD-10-CM | POA: Diagnosis not present

## 2020-11-16 DIAGNOSIS — I2510 Atherosclerotic heart disease of native coronary artery without angina pectoris: Secondary | ICD-10-CM | POA: Diagnosis not present

## 2020-11-16 DIAGNOSIS — E7800 Pure hypercholesterolemia, unspecified: Secondary | ICD-10-CM | POA: Diagnosis not present

## 2020-11-16 DIAGNOSIS — U071 COVID-19: Secondary | ICD-10-CM | POA: Diagnosis not present

## 2020-11-16 NOTE — Patient Outreach (Addendum)
Aging Gracefully Program  11/16/2020  [name] 05/08/1951 [id number]  [facility] Evaluation Interviewer attempted to call patient on today regarding Aging Gracefully referral. CMA was able to get patient on the phone, but patient had to reschedule due to time conflict.  Will attempt to call back within 1 week.  [name] Care Management Assistant [phone]

## 2020-11-19 DIAGNOSIS — G4733 Obstructive sleep apnea (adult) (pediatric): Secondary | ICD-10-CM | POA: Diagnosis not present

## 2020-11-22 ENCOUNTER — Other Ambulatory Visit: Payer: Self-pay

## 2020-11-22 NOTE — Patient Outreach (Signed)
Aging Gracefully Program  11/22/2020  [name] 28-Nov-1950 [id number]  [facility] Evaluation Interviewer made contact with patient. Aging Gracefully 5 month survey completed.   Interviewer will send follow up to [name] with [employer] for gift card.  [name] Care Management Assistant [phone]

## 2020-12-20 DIAGNOSIS — G4733 Obstructive sleep apnea (adult) (pediatric): Secondary | ICD-10-CM | POA: Diagnosis not present

## 2021-01-13 DIAGNOSIS — I10 Essential (primary) hypertension: Secondary | ICD-10-CM | POA: Diagnosis not present

## 2021-01-13 DIAGNOSIS — E7800 Pure hypercholesterolemia, unspecified: Secondary | ICD-10-CM | POA: Diagnosis not present

## 2021-01-27 DIAGNOSIS — H04123 Dry eye syndrome of bilateral lacrimal glands: Secondary | ICD-10-CM | POA: Diagnosis not present

## 2021-01-27 DIAGNOSIS — H40023 Open angle with borderline findings, high risk, bilateral: Secondary | ICD-10-CM | POA: Diagnosis not present

## 2021-01-27 DIAGNOSIS — M3210 Systemic lupus erythematosus, organ or system involvement unspecified: Secondary | ICD-10-CM | POA: Diagnosis not present

## 2021-01-27 DIAGNOSIS — Z79899 Other long term (current) drug therapy: Secondary | ICD-10-CM | POA: Diagnosis not present

## 2021-02-27 DIAGNOSIS — M65331 Trigger finger, right middle finger: Secondary | ICD-10-CM | POA: Diagnosis not present

## 2021-02-27 DIAGNOSIS — M329 Systemic lupus erythematosus, unspecified: Secondary | ICD-10-CM | POA: Diagnosis not present

## 2021-02-27 DIAGNOSIS — M79604 Pain in right leg: Secondary | ICD-10-CM | POA: Diagnosis not present

## 2021-02-27 DIAGNOSIS — R899 Unspecified abnormal finding in specimens from other organs, systems and tissues: Secondary | ICD-10-CM | POA: Diagnosis not present

## 2021-02-27 DIAGNOSIS — M170 Bilateral primary osteoarthritis of knee: Secondary | ICD-10-CM | POA: Diagnosis not present

## 2021-02-28 ENCOUNTER — Other Ambulatory Visit: Payer: Self-pay | Admitting: Cardiovascular Disease

## 2021-03-17 ENCOUNTER — Other Ambulatory Visit: Payer: Self-pay

## 2021-03-17 NOTE — Patient Outreach (Signed)
Aging Gracefully Program  03/17/2021  SYDNE KRAHL [birth year] [id number]  [facility] Evaluation Interviewer attempted to call patient on today regarding final Aging Gracefully referral. No answer from patient after multiple rings. CMA left confidential voicemail for patient to return call.  Will attempt to call back within 1 week.   [facility] Care Management Assistant [phone]

## 2021-03-24 ENCOUNTER — Other Ambulatory Visit: Payer: Self-pay

## 2021-03-24 NOTE — Patient Outreach (Signed)
Aging Gracefully Program  03/24/2021  [name] December 23, 1950 [id number]  [facility] Evaluation Interviewer second attempt to call patient on today regarding Aging Gracefully final evaluation. No answer from patient after multiple rings. CMA left confidential voicemail for patient to return call.  Will attempt to call back within 1 week.   [facility] Care Management Assistant [phone]

## 2021-03-27 ENCOUNTER — Other Ambulatory Visit: Payer: Self-pay

## 2021-03-27 NOTE — Patient Outreach (Signed)
Aging Gracefully Program  03/27/2021  [name] Apr 04, 1951 [id number]  [facility] Evaluation Interviewer made contact with patient. Aging Gracefully final survey completed.   Interviewer will send referral to [name] at [facility] for gift card.   [facility] Care Management Assistant [phone]

## 2021-03-29 ENCOUNTER — Other Ambulatory Visit: Payer: Self-pay

## 2021-03-29 MED ORDER — METOPROLOL TARTRATE 50 MG PO TABS: ORAL_TABLET | ORAL | 0 refills | Status: DC

## 2021-04-24 ENCOUNTER — Other Ambulatory Visit: Payer: Self-pay | Admitting: Family Medicine

## 2021-04-24 DIAGNOSIS — Z1231 Encounter for screening mammogram for malignant neoplasm of breast: Secondary | ICD-10-CM

## 2021-04-27 ENCOUNTER — Other Ambulatory Visit: Payer: Self-pay

## 2021-04-27 ENCOUNTER — Ambulatory Visit
Admission: RE | Admit: 2021-04-27 | Discharge: 2021-04-27 | Disposition: A | Discharge status: Home / Self Care | Payer: PPO | Source: Ambulatory Visit | Attending: Family Medicine | Admitting: Family Medicine

## 2021-04-27 DIAGNOSIS — Z1231 Encounter for screening mammogram for malignant neoplasm of breast: Secondary | ICD-10-CM | POA: Diagnosis not present

## 2021-05-24 DIAGNOSIS — I10 Essential (primary) hypertension: Secondary | ICD-10-CM | POA: Diagnosis not present

## 2021-05-24 DIAGNOSIS — E2839 Other primary ovarian failure: Secondary | ICD-10-CM | POA: Diagnosis not present

## 2021-05-24 DIAGNOSIS — I2510 Atherosclerotic heart disease of native coronary artery without angina pectoris: Secondary | ICD-10-CM | POA: Diagnosis not present

## 2021-05-24 DIAGNOSIS — Z0000 Encounter for general adult medical examination without abnormal findings: Secondary | ICD-10-CM | POA: Diagnosis not present

## 2021-05-24 DIAGNOSIS — M329 Systemic lupus erythematosus, unspecified: Secondary | ICD-10-CM | POA: Diagnosis not present

## 2021-05-24 DIAGNOSIS — F439 Reaction to severe stress, unspecified: Secondary | ICD-10-CM | POA: Diagnosis not present

## 2021-05-24 DIAGNOSIS — E7800 Pure hypercholesterolemia, unspecified: Secondary | ICD-10-CM | POA: Diagnosis not present

## 2021-05-24 DIAGNOSIS — Z1389 Encounter for screening for other disorder: Secondary | ICD-10-CM | POA: Diagnosis not present

## 2021-05-25 ENCOUNTER — Other Ambulatory Visit: Payer: Self-pay | Admitting: Family Medicine

## 2021-05-25 DIAGNOSIS — E2839 Other primary ovarian failure: Secondary | ICD-10-CM

## 2021-07-13 ENCOUNTER — Other Ambulatory Visit: Payer: Self-pay | Admitting: Cardiovascular Disease

## 2021-09-04 DIAGNOSIS — Z23 Encounter for immunization: Secondary | ICD-10-CM | POA: Diagnosis not present

## 2021-10-15 IMAGING — MG DIGITAL SCREENING BILAT W/ TOMO W/ CAD
6 of 10 series · 6 of 30 positions shown · non-contrast
Comparison: Previous exam(s).

CLINICAL DATA: Screening.

EXAM:
DIGITAL SCREENING BILATERAL MAMMOGRAM WITH TOMO AND CAD

[L MLO synth-2D]
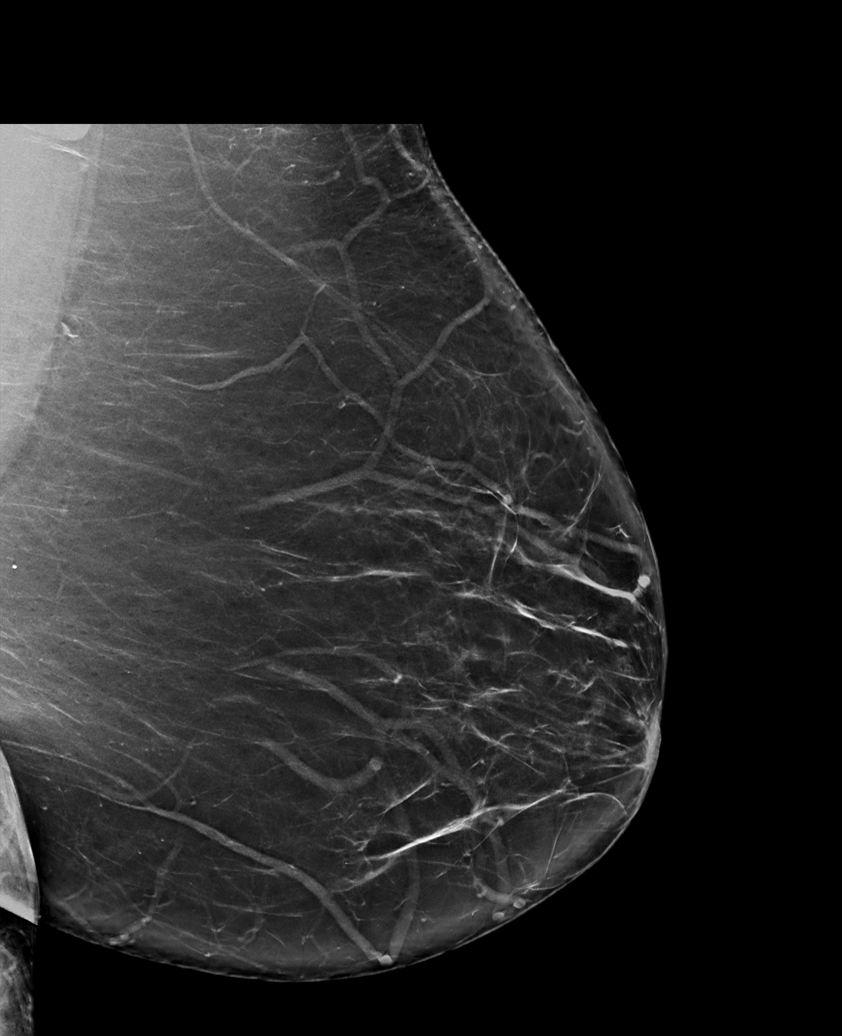

[L CV synth-2D]
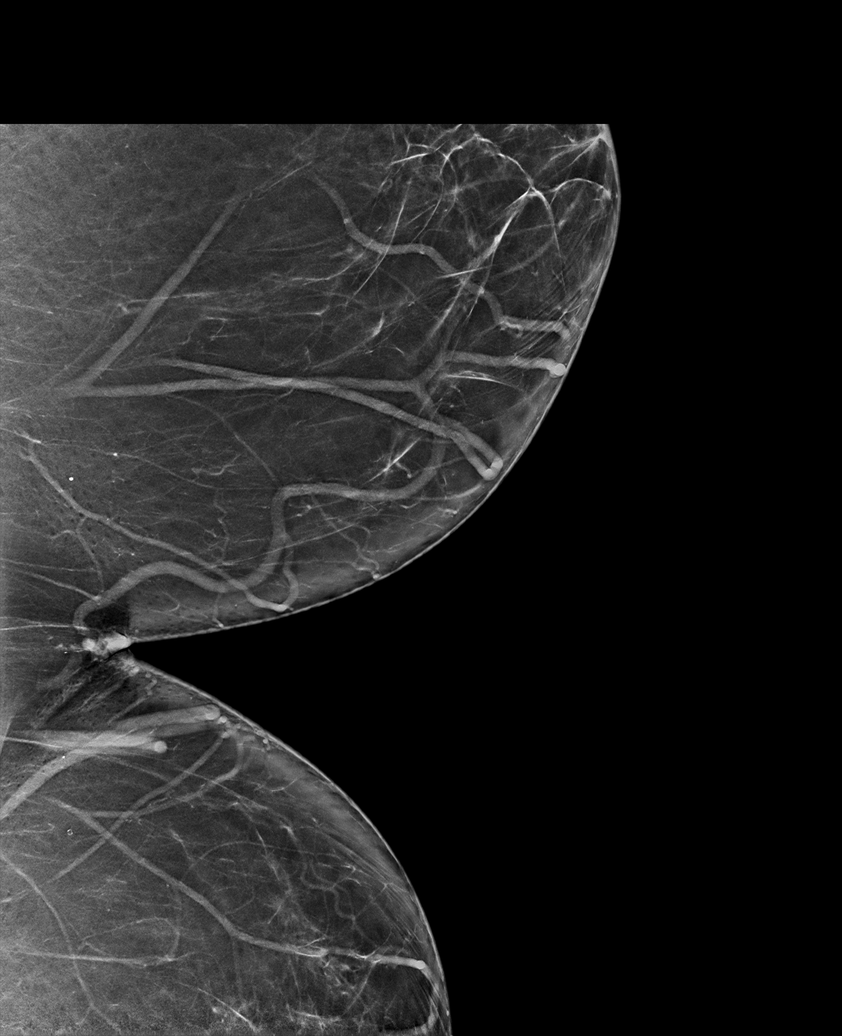

[R MLO synth-2D]
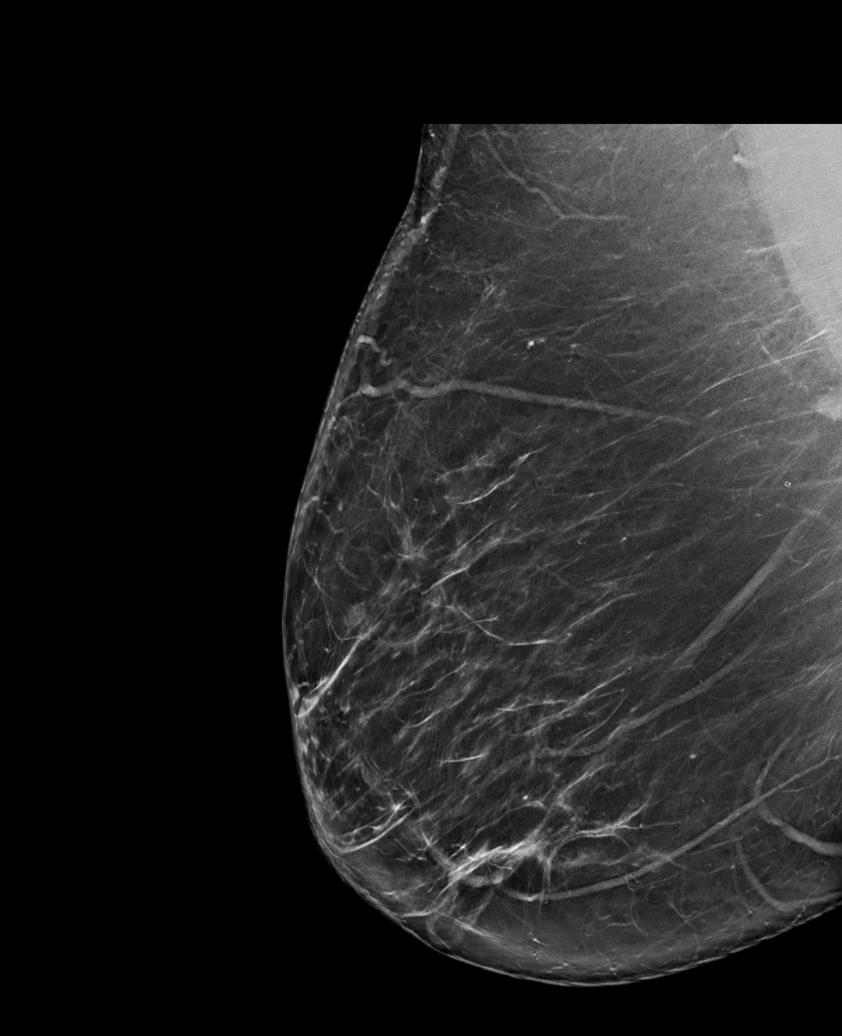

[R CC synth-2D]
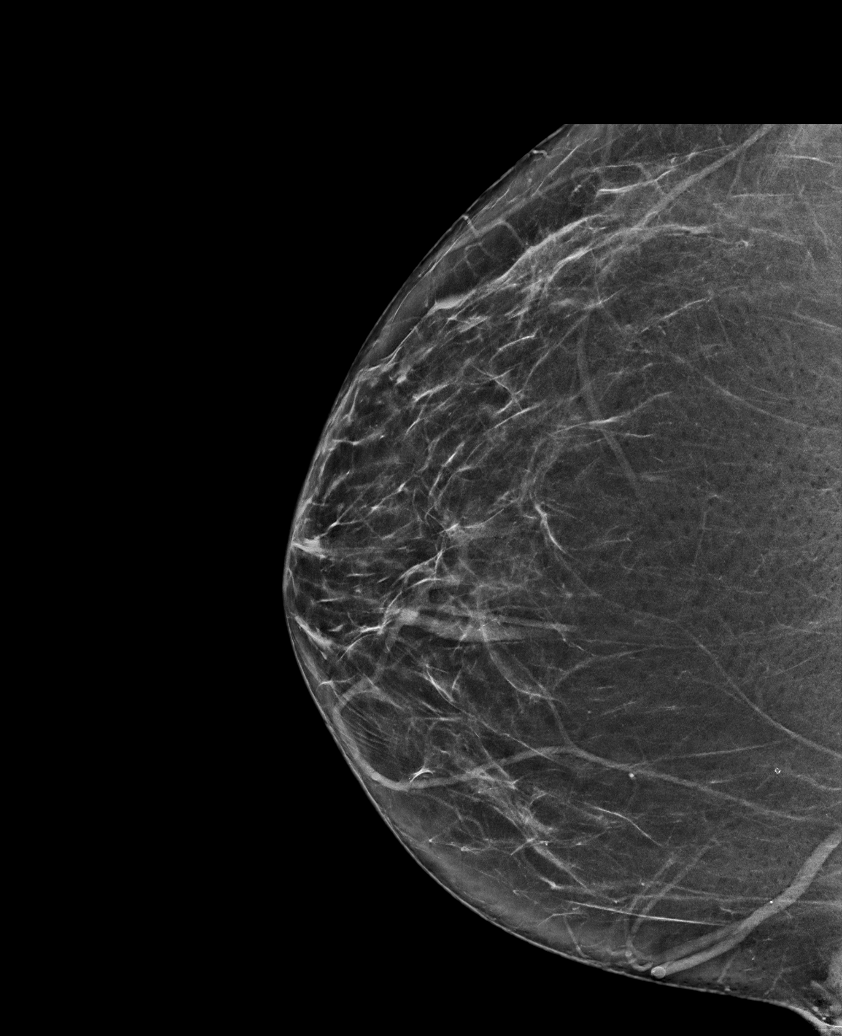

[L CC synth-2D]
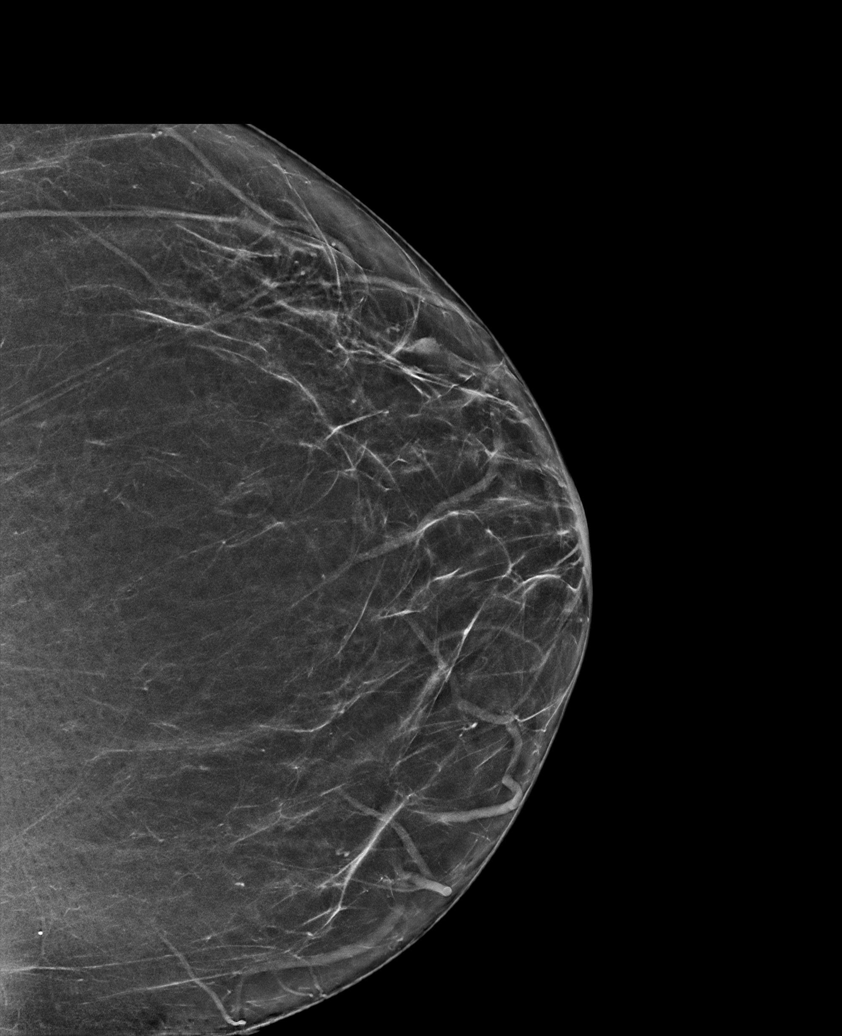

[L MLO tomo · tomo slice 47/94.0]
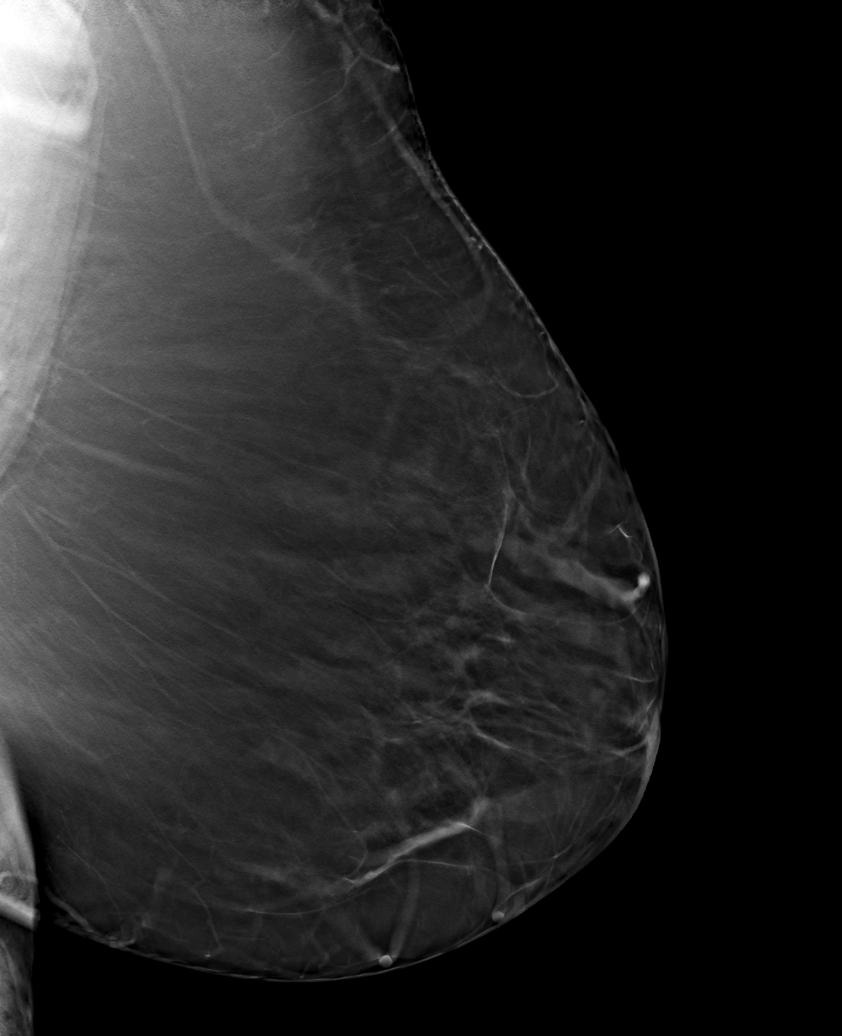

[6 of 30 positions shown; findings below may reference images not displayed]

ACR Breast Density Category b: There are scattered areas of
fibroglandular density.
FINDINGS: There are no findings suspicious for malignancy. Images were
processed with CAD.
IMPRESSION: No mammographic evidence of malignancy. A result letter of this
screening mammogram will be mailed directly to the patient.

RECOMMENDATION:
Screening mammogram in one year. (Code:[id number])

BI-RADS CATEGORY  1: Negative.

## 2021-11-15 DIAGNOSIS — R899 Unspecified abnormal finding in specimens from other organs, systems and tissues: Secondary | ICD-10-CM | POA: Diagnosis not present

## 2021-11-15 DIAGNOSIS — Z6841 Body Mass Index (BMI) 40.0 and over, adult: Secondary | ICD-10-CM | POA: Diagnosis not present

## 2021-11-15 DIAGNOSIS — M79604 Pain in right leg: Secondary | ICD-10-CM | POA: Diagnosis not present

## 2021-11-15 DIAGNOSIS — M170 Bilateral primary osteoarthritis of knee: Secondary | ICD-10-CM | POA: Diagnosis not present

## 2021-11-15 DIAGNOSIS — M329 Systemic lupus erythematosus, unspecified: Secondary | ICD-10-CM | POA: Diagnosis not present

## 2021-11-15 DIAGNOSIS — M65331 Trigger finger, right middle finger: Secondary | ICD-10-CM | POA: Diagnosis not present

## 2021-11-17 ENCOUNTER — Ambulatory Visit
Admission: RE | Admit: 2021-11-17 | Discharge: 2021-11-17 | Disposition: A | Discharge status: Home / Self Care | Payer: PPO | Source: Ambulatory Visit | Attending: Family Medicine | Admitting: Family Medicine

## 2021-11-17 ENCOUNTER — Other Ambulatory Visit: Payer: Self-pay | Admitting: Cardiovascular Disease

## 2021-11-17 DIAGNOSIS — E2839 Other primary ovarian failure: Secondary | ICD-10-CM

## 2021-11-17 DIAGNOSIS — Z780 Asymptomatic menopausal state: Secondary | ICD-10-CM | POA: Diagnosis not present

## 2021-11-21 DIAGNOSIS — U071 COVID-19: Secondary | ICD-10-CM | POA: Diagnosis not present

## 2021-11-30 ENCOUNTER — Ambulatory Visit: Payer: PPO | Admitting: Cardiovascular Disease

## 2021-11-30 ENCOUNTER — Encounter: Payer: Self-pay | Admitting: Cardiovascular Disease

## 2021-11-30 ENCOUNTER — Other Ambulatory Visit: Payer: Self-pay

## 2021-11-30 VITALS — BP 178/76 | HR 57 | Ht 61.0 in | Wt 247.8 lb

## 2021-11-30 DIAGNOSIS — E785 Hyperlipidemia, unspecified: Secondary | ICD-10-CM | POA: Diagnosis not present

## 2021-11-30 DIAGNOSIS — M329 Systemic lupus erythematosus, unspecified: Secondary | ICD-10-CM | POA: Diagnosis not present

## 2021-11-30 DIAGNOSIS — I10 Essential (primary) hypertension: Secondary | ICD-10-CM | POA: Diagnosis not present

## 2021-11-30 DIAGNOSIS — Z951 Presence of aortocoronary bypass graft: Secondary | ICD-10-CM | POA: Diagnosis not present

## 2021-11-30 DIAGNOSIS — I440 Atrioventricular block, first degree: Secondary | ICD-10-CM

## 2021-11-30 DIAGNOSIS — G4733 Obstructive sleep apnea (adult) (pediatric): Secondary | ICD-10-CM

## 2021-11-30 DIAGNOSIS — I4510 Unspecified right bundle-branch block: Secondary | ICD-10-CM

## 2021-11-30 DIAGNOSIS — I2510 Atherosclerotic heart disease of native coronary artery without angina pectoris: Secondary | ICD-10-CM

## 2021-11-30 MED ORDER — LOSARTAN POTASSIUM-HCTZ 50-12.5 MG PO TABS: 1.0000 | ORAL_TABLET | Freq: Every day | ORAL | 3 refills | Status: DC

## 2021-11-30 NOTE — Progress Notes (Signed)
Patient ID: [name], female   DOB: 10-Feb-1951, 71 y.o.   MRN: [id number]      Primary M.D.: Dr. Carol Ada   HPI: [name] is a 71 y.o. female who presents to the office for a 6 month follow-up cardiology evaluation.  [name] has a history of coronary artery disease in October 2008 underwent CABG surgery by Dr. Cyndia Bent with a LIMA to the LAD, vein to the obtuse marginal, vein to the RCA. Her last nuclear perfusion study in October 2013 was unchanged and showed mild inferior abnormality.  She has a history of moderate obstructive sleep apnea which is severe during REM sleep. She was initially started on CPAP therapy 2010 but when I  saw her in April 2014 she had not been using therapy for over the past year. At that time, I strongly advise reinstitution of CPAP therapy.  She now uses [employer] as her DME company.  She recently received a new mask, which is a full face mask.  She admits to 100% compliance.  She is unaware of breakthrough snoring.  She denies excessive daytime sleepiness.  Additional problems include hyperlipidemia, right bundle branch block, systemic lupus erythematosus, as well as obesity.  She sees Dr. Amil Swanson for her lupus.  When I saw her in October 2017, she had experienced an episode of nonexertional chest tightness which lasted for several minutes.   She had not been very active due to bilateral deep discomfort. She had not been very active due to bilateral knee pain.  In June 2017 an echo Doppler study showed an EF of 60-65% with grade 2 diastolic dysfunction.  Over the past year, she has continued to use CPAP with 100% compliance.  She has been on aspirin.  benazapril 10 mg, HCTZ as needed, and metoprolol 50 mg twice a day.  When I saw her last year, she was changed from simvastatin to rosuvastatin for more aggressive lipid intervention.  She continues to be on rosuvastatin 20 mg for hyperlipidemia.  She underwent a nuclear perfusion study for a 10 year  follow-up evaluation in October 2018.  This remained low risk and showed a small inferobasilar defect, without associated ischemia and with akinesis of this info basal segment.  Ejection fraction was 61%.  She underwent laboratory on 10/07/2017 which showed a total cluster 148, HDL 68, LDL 66, and triglycerides 68.    In July 2020, she was evaluated in a 35-month follow-up telemedicine visit.  Since I previously seen her she had remained stable from a cardiac standpoint.  Unfortunately, her CPAP therapy stopped working last year and has not been on therapy.  She had not been sleeping as well.  She denied recurrent anginal symptomatology or shortness of breath.  She was having difficulty with bilateral knee discomfort and therefore was not able to walk well.  She continues to be followed by Dr. Amil Swanson for lupus.  Since she had her initial sleep study in  2010 demonstrated severe sleep apnea with severe sleep apnea during REM sleep I recommended the patient to undergo a split night protocol to reassess her OSA with plan to prescribe a new CPAP machine.  I saw her in December 2020. Apparently, due to the COVID-19 pandemic she did not have her follow-up sleep evaluation.  She continued to use stationary bike for exercise.  She has been caring for her husband with dementia.    She underwent a follow-up sleep study on November 22, 2019.  This again showed severe  obstructive sleep apnea with an AHI of 45.2 and with absence of rem sleep during the diagnostic portion of the study.  She qualified for split-night protocol and CPAP titration was initiated and titrated up to 11 cm water pressure.  I last saw her on Mar 29, 2020.  At that time she was again using CPAP.   I obtained a download initially from February 15 through January 19, 2020.  Usage days was 87% with average usage 7 hours and 14 minutes.  At 11 cm water pressure, AHI was 3.2.  Subsequently, her mask split and [employer] is sending a new fullface mask.  She  has felt better since reinitiating CPAP therapy.  She has more energy.  She is unaware of breakthrough snoring.  She is unaware of nocturnal palpitations.  I have not seen her since May 2021.  Apparently, her husband was diagnosed with dementia and was waking up very frequently at night.  As result she stopped using CPAP to be available with him.  Her sleep has been poor.  She has continued to be on benazepril 10 mg, Toprol tartrate 50 mg twice a day for hypertension, Plaquenil for lupus, and rosuvastatin 20 mg for hyperlipidemia.  She admits to ankle swelling.  She feels fatigued.  She goes to bed around 1030 and wakes up around 6 but is up numerous times caring for him.  She presents for evaluation.  Past Medical History:  Diagnosis Date   CAD (coronary artery disease)    Hyperlipidemia    Hypertension    Obesity    OSA (obstructive sleep apnea)    RBBB    Sleep apnea    Systemic lupus erythematosus ([place])     Past Surgical History:  Procedure Laterality Date   CARDIAC CATHETERIZATION  09/02/2007   CORONARY ANGIOPLASTY     CORONARY ARTERY BYPASS GRAFT  09/02/2007   LIMA to the LAD,SVG to obtuse marginal branch of the left CX,SVG to the RCA    Allergies  Allergen Reactions   Aspirin Nausea And Vomiting   Meloxicam Hives    Current Outpatient Medications  Medication Sig Dispense Refill   Cyanocobalamin (VITAMIN B12 PO) Take 1 tablet by mouth daily.     hydroxychloroquine (PLAQUENIL) 200 MG tablet Take by mouth daily.     losartan-hydrochlorothiazide (HYZAAR) 50-12.5 MG tablet Take 1 tablet by mouth daily. 90 tablet 3   metoprolol tartrate (LOPRESSOR) 50 MG tablet Take 1 tablet by mouth twice a day (patient must schedule appointment for future refills) 60 tablet 0   NON FORMULARY CPAP therapy     OVER THE COUNTER MEDICATION Take 1 capsule by mouth daily. Complete omega     OVER THE COUNTER MEDICATION Take 1 tablet by mouth daily. Active Adult 50 plus     rosuvastatin (CRESTOR) 20  MG tablet TAKE 1 TABLET BY MOUTH DAILY. 90 tablet 1   No current facility-administered medications for this visit.    Social History   Socioeconomic History   Marital status: Married    Spouse name: Not on file   Number of children: Not on file   Years of education: Not on file   Highest education level: Not on file  Occupational History   Not on file  Tobacco Use   Smoking status: Never   Smokeless tobacco: Never  Substance and Sexual Activity   Alcohol use: No    Alcohol/week: 0.0 standard drinks   Drug use: No   Sexual activity: Not on file  Other Topics Concern   Not on file  Social History Narrative   Not on file   Social Determinants of Health   Financial Resource Strain: Not on file  Food Insecurity: Not on file  Transportation Needs: Not on file  Physical Activity: Not on file  Stress: Not on file  Social Connections: Not on file  Intimate Partner Violence: Not on file   Social history is notable that she is married has one child one deceased. She does not routinely exercise. He denies alcohol use or tobacco.   Family History  Problem Relation Age of Onset   Heart disease Mother    Heart attack Mother    Hypertension Mother    Hyperlipidemia Mother    Cancer - Other Mother        breast   Heart attack Father    Heart disease Father    Hyperlipidemia Father    Hypertension Father    Cancer - Other Sister        thyroid   Diabetes Brother    Cancer - Other Sister        breast   Cancer - Other Sister        breast;  had mastectomy   Hypertension Sister    ROS General: Negative; No fevers, chills, or night sweats; Positive for morbid obesity HEENT: Negative; No changes in vision or hearing, sinus congestion, difficulty swallowing Pulmonary: Negative; No cough, wheezing, shortness of breath, hemoptysis Cardiovascular: See history of present illness Positive for ankle swelling, intermittently GI: Negative; No nausea, vomiting, diarrhea, or  abdominal pain GU: Negative; No dysuria, hematuria, or difficulty voiding Musculoskeletal: Negative; no myalgias, joint pain, or weakness Rheumatologic c: Positive for systemic lupus erythematosus Hematologic/Oncology: Negative; no easy bruising, bleeding Endocrine: Negative; no heat/cold intolerance; no diabetes Neuro: Negative; no changes in balance, headaches Skin: Negative; No rashes or skin lesions Psychiatric: Negative; No behavioral problems, depression Sleep: Positive for obstructive sleep apnea, now back on CPAP therapy; [employer] is her DME company,no bruxism, restless legs, hypnogognic hallucinations, no cataplexy Other comprehensive 14 point system review is negative.   PE BP (!) 178/76    Pulse (!) 57    Ht $R'5\' 1"'Zo$  (1.549 m)    Wt 247 lb 12.8 oz (112.4 kg)    SpO2 97%    BMI 46.82 kg/m    Repeat blood pressure by me was elevated at 164/80  Wt Readings from Last 3 Encounters:  11/30/21 247 lb 12.8 oz (112.4 kg)  03/29/20 270 lb (122.5 kg)  11/22/19 258 lb (117 kg)   General: Alert, oriented, no distress.  Skin: normal turgor, no rashes, warm and dry HEENT: Normocephalic, atraumatic. Pupils equal round and reactive to light; sclera anicteric; extraocular muscles intact;  Nose without nasal septal hypertrophy Mouth/Parynx benign; Mallinpatti scale 4 Neck: No JVD, no carotid bruits; normal carotid upstroke Lungs: clear to ausculatation and percussion; no wheezing or rales Chest wall: without tenderness to palpitation Heart: PMI not displaced, RRR, s1 s2 normal, 5-3/7 systolic murmur, no diastolic murmur, no rubs, gallops, thrills, or heaves Abdomen: soft, nontender; no hepatosplenomehaly, BS+; abdominal aorta nontender and not dilated by palpation. Back: no CVA tenderness Pulses 2+ Musculoskeletal: full range of motion, normal strength, no joint deformities Extremities: no clubbing cyanosis or edema, Homan's sign negative  Neurologic: grossly nonfocal; Cranial nerves  grossly wnl Psychologic: Normal mood and affect   November 30, 2021 ECG (independently read by me): Sinus bradycardia at 57, 1st degree AV block; PR 222  msec, RBBB  Mar 29, 2021 ECG (independently read by me): Sinus rhythm with mild sinus arrhythmia.  First-degree AV block with PR of 218 ms.  Q-wave in lead III.  Bundle branch block with repolarization changes.  December 2020 ECG (independently read by me): Normal sinus rhythm at 71 bpm.  Isolated PVC.  Right bundle branch block with repolarization changes.  Inferior Q-wave in lead III.  QTc interval 447 ms.  December 2018 ECG (independently read by me): Normal sinus rhythm at 72 bpm.  Right bundle branch block with repolarization changes.  October 2018 ECG (independently read by me): Normal sinus rhythm at 73 bpm.  Right bundle branch block with repolarization changes.  QTc interval 460 ms.  June 2017 ECG (independently read by me): Normal sinus rhythm at 65 bpm.  Right bundle-branch block with repolarization changes.  Small nondiagnostic inferior Q waves.  T-wave abnormality laterally.  October 2016 ECG (independently read by me, and (: Normal sinus rhythm at 71 beats per minute.  Right bundle branch block with repolarization changes.  Q wave in lead 3.  November 2014 ECG: Sinus rhythm with right bundle branch block. First degree AV block with a PR interval of 208 ms   LABS:  BMP Latest Ref Rng & Units 10/07/2017 09/17/2013 02/20/2011  Glucose 65 - 99 mg/dL 91 101(H) 132(H)  BUN 8 - 27 mg/dL $Remove'10 14 17  'IARiCNu$ Creatinine 0.57 - 1.00 mg/dL 0.78 0.90 1.03  BUN/Creat Ratio 12 - 28 13 - -  Sodium 134 - 144 mmol/L 144 138 141  Potassium 3.5 - 5.2 mmol/L 4.5 4.0 3.9  Chloride 96 - 106 mmol/L 105 104 104  CO2 20 - 29 mmol/L $RemoveB'24 26 27  'nOMmWfeh$ Calcium 8.7 - 10.3 mg/dL 9.6 9.5 9.6       Component Value Date/Time   PROT 8.1 10/07/2017 1400   ALBUMIN 4.3 10/07/2017 1400   AST 34 10/07/2017 1400   ALT 23 10/07/2017 1400   ALKPHOS 84 10/07/2017 1400    BILITOT 0.3 10/07/2017 1400     CBC Latest Ref Rng & Units 10/07/2017 09/17/2013 02/20/2011  WBC 3.4 - 10.8 x10E3/uL 5.8 5.2 4.2  Hemoglobin 11.1 - 15.9 g/dL 14.0 14.4 14.8  Hematocrit 34.0 - 46.6 % 41.7 43.2 45.3  Platelets 150 - 379 x10E3/uL 228 205 178       Component Value Date/Time   PROBNP 858.0 (H) 09/09/2007 0340       Component Value Date/Time   CHOL 148 10/07/2017 1400   TRIG 68 10/07/2017 1400   HDL 68 10/07/2017 1400   CHOLHDL 2.2 10/07/2017 1400   CHOLHDL 2.4 02/04/2015 0957   VLDL 20 02/04/2015 0957   LDLCALC 66 10/07/2017 1400     RADIOLOGY: No results found.  IMPRESSION:  1. CAD in native artery   2. Hyperlipidemia with target LDL less than 70   3. S/P CABG (coronary artery bypass graft)   4. Essential hypertension, benign   5. OSA (obstructive sleep apnea)   6. Morbid obesity ([place])   7. Lupus ([place])   8. RBBB   9. First degree heart block     ASSESSMENT AND PLAN: Ms. Goodroe is a 71 year old female with known CAD who underwent CABG revascularization surgery in October 2008.  Her prior nuclear studies have shown mild inferior abnormality.  Her last nuclear perfusion study again showed a probable small region of inferobasal scar without associated ischemia.  The study was low risk.  Global ejection fraction was 61%.  She  continues to be asymptomatic and denies any current chest pain or exertional dyspnea.  Her blood pressure today is elevated repeat by me was 164/80.  She has been taking benazepril 10 mg in addition to metoprolol tartrate 50 mg twice a day.  She has 1+ ankle edema bilaterally.  I have recommended she discontinue benazepril and in its place we will start her on combination losartan HCT 50/12.5 mg which will be helpful both for improved blood pressure control and leg swelling.  I again had a very long discussion with her regarding the importance of resumption of CPAP therapy.  He previously had met compliance standards and still has her machine.   I again discussed adverse consequences if her sleep apnea is untreated and I suspect this may be contributing to her fatigability, poor sleep, as well as hypertension.  She continues to be on rosuvastatin 20 mg for hyperlipidemia and takes Plaquenil for lupus.  Her ECG today shows sinus rhythm with right bundle branch block and first-degree AV block.  She has not had recent laboratory since July 2022 and I will check a complete set of fasting labs including a comprehensive metabolic panel, CBC and lipid studies.  TSH was 1.79 in July 2022.  BMI is consistent with morbid obesity at 46.8.  We discussed the importance of weight loss and increased exercise.  She will be following up with her primary physician Dr. Carol Ada in August.  I will see her in 3 to 4 months for follow-up evaluation    Troy Sine, MD, [facility]  12/07/2021 2:54 PM

## 2021-11-30 NOTE — Patient Instructions (Addendum)
Medication Instructions:  STOP benazepril   START losartan-hctz 50-12.5mg  daily   *If you need a refill on your cardiac medications before your next appointment, please call your pharmacy*   Lab Work: FASTING lab work after 2 weeks on the new medication  -- CBC, CMET, lipid panel   If you have labs (blood work) drawn today and your tests are completely normal, you will receive your results only by: [place] (if you have MyChart) OR A paper copy in the mail If you have any lab test that is abnormal or we need to change your treatment, we will call you to review the results.   Testing/Procedures: NONE   Follow-Up: At [facility], you and your health needs are our priority.  As part of our continuing mission to provide you with exceptional heart care, we have created designated Provider Care Teams.  These Care Teams include your primary Cardiologist (physician) and Advanced Practice Providers (APPs -  Physician Assistants and Nurse Practitioners) who all work together to provide you with the care you need, when you need it.  We recommend signing up for the patient portal called "MyChart".  Sign up information is provided on this After Visit Summary.  MyChart is used to connect with patients for Virtual Visits (Telemedicine).  Patients are able to view lab/test results, encounter notes, upcoming appointments, etc.  Non-urgent messages can be sent to your provider as well.   To learn more about what you can do with MyChart, go to [email or web address].    Your next appointment:   3-4 month(s)  The format for your next appointment:   In Person  Provider:   Dr. Debara Pickett

## 2021-12-07 ENCOUNTER — Encounter: Payer: Self-pay | Admitting: Cardiovascular Disease

## 2021-12-13 DIAGNOSIS — G4733 Obstructive sleep apnea (adult) (pediatric): Secondary | ICD-10-CM | POA: Diagnosis not present

## 2021-12-21 DIAGNOSIS — E785 Hyperlipidemia, unspecified: Secondary | ICD-10-CM | POA: Diagnosis not present

## 2021-12-21 LAB — COMPREHENSIVE METABOLIC PANEL
ALT: 26 IU/L (ref 0–32)
AST: 40 IU/L (ref 0–40)
Albumin/Globulin Ratio: 1.2 (ref 1.2–2.2)
Albumin: 4.4 g/dL (ref 3.8–4.8)
Alkaline Phosphatase: 90 IU/L (ref 44–121)
BUN/Creatinine Ratio: 20 (ref 12–28)
BUN: 17 mg/dL (ref 8–27)
Bilirubin Total: 0.4 mg/dL (ref 0.0–1.2)
CO2: 25 mmol/L (ref 20–29)
Calcium: 9.8 mg/dL (ref 8.7–10.3)
Chloride: 99 mmol/L (ref 96–106)
Creatinine, Ser: 0.85 mg/dL (ref 0.57–1.00)
Globulin, Total: 3.8 g/dL (ref 1.5–4.5)
Glucose: 94 mg/dL (ref 70–99)
Potassium: 4.3 mmol/L (ref 3.5–5.2)
Sodium: 138 mmol/L (ref 134–144)
Total Protein: 8.2 g/dL (ref 6.0–8.5)
eGFR: 74 mL/min/{1.73_m2} (ref >59)

## 2021-12-21 LAB — CBC
Hematocrit: 44.8 % (ref 34.0–46.6)
Hemoglobin: 14.4 g/dL (ref 11.1–15.9)
MCH: 28.6 pg (ref 26.6–33.0)
MCHC: 32.1 g/dL (ref 31.5–35.7)
MCV: 89 fL (ref 79–97)
Platelets: 168 10*3/uL (ref 150–450)
RBC: 5.03 x10E6/uL (ref 3.77–5.28)
RDW: 12.5 % (ref 11.7–15.4)
WBC: 5.3 10*3/uL (ref 3.4–10.8)

## 2021-12-21 LAB — LIPID PANEL
Chol/HDL Ratio: 2.2 ratio (ref 0.0–4.4)
Cholesterol, Total: 153 mg/dL (ref 100–199)
HDL: 70 mg/dL (ref >39)
LDL Chol Calc (NIH): 70 mg/dL (ref 0–99)
Triglycerides: 67 mg/dL (ref 0–149)
VLDL Cholesterol Cal: 13 mg/dL (ref 5–40)

## 2021-12-29 ENCOUNTER — Other Ambulatory Visit: Payer: Self-pay | Admitting: Cardiovascular Disease

## 2021-12-30 ENCOUNTER — Other Ambulatory Visit: Payer: Self-pay | Admitting: Cardiovascular Disease

## 2022-01-01 ENCOUNTER — Telehealth: Payer: Self-pay | Admitting: Cardiovascular Disease

## 2022-01-01 MED ORDER — METOPROLOL TARTRATE 50 MG PO TABS: ORAL_TABLET | ORAL | 3 refills | Status: DC

## 2022-01-01 NOTE — Telephone Encounter (Signed)
90-day supply of Metoprolol Tartrate sent to [facility] 01/01/22

## 2022-01-01 NOTE — Telephone Encounter (Signed)
°*  STAT* If patient is at the pharmacy, call can be transferred to refill team.   1. Which medications need to be refilled? (please list name of each medication and dose if known) metoprolol tartrate (LOPRESSOR) 50 MG tablet  2. Which pharmacy/location (including street and city if local pharmacy) is medication to be sent to?[facility] ([place] - [place], [place] - [address]  3. Do they need a 30 day or 90 day supply? 30 ds

## 2022-01-02 NOTE — Telephone Encounter (Signed)
Message sent to patient. medication was refilled 01/01/22.

## 2022-01-02 NOTE — Telephone Encounter (Signed)
Prescription was refilled 01/01/22 via e-scribed

## 2022-02-16 DIAGNOSIS — H40023 Open angle with borderline findings, high risk, bilateral: Secondary | ICD-10-CM | POA: Diagnosis not present

## 2022-02-16 DIAGNOSIS — H524 Presbyopia: Secondary | ICD-10-CM | POA: Diagnosis not present

## 2022-02-16 DIAGNOSIS — H353131 Nonexudative age-related macular degeneration, bilateral, early dry stage: Secondary | ICD-10-CM | POA: Diagnosis not present

## 2022-02-16 DIAGNOSIS — H25813 Combined forms of age-related cataract, bilateral: Secondary | ICD-10-CM | POA: Diagnosis not present

## 2022-02-16 DIAGNOSIS — Z79899 Other long term (current) drug therapy: Secondary | ICD-10-CM | POA: Diagnosis not present

## 2022-03-22 ENCOUNTER — Ambulatory Visit: Payer: PPO | Admitting: Cardiovascular Disease

## 2022-03-28 ENCOUNTER — Encounter: Payer: Self-pay | Admitting: Cardiovascular Disease

## 2022-03-28 ENCOUNTER — Ambulatory Visit: Payer: PPO | Admitting: Cardiovascular Disease

## 2022-03-28 DIAGNOSIS — I10 Essential (primary) hypertension: Secondary | ICD-10-CM | POA: Diagnosis not present

## 2022-03-28 DIAGNOSIS — E785 Hyperlipidemia, unspecified: Secondary | ICD-10-CM

## 2022-03-28 DIAGNOSIS — I4510 Unspecified right bundle-branch block: Secondary | ICD-10-CM

## 2022-03-28 DIAGNOSIS — G4733 Obstructive sleep apnea (adult) (pediatric): Secondary | ICD-10-CM | POA: Diagnosis not present

## 2022-03-28 DIAGNOSIS — Z951 Presence of aortocoronary bypass graft: Secondary | ICD-10-CM | POA: Diagnosis not present

## 2022-03-28 DIAGNOSIS — I440 Atrioventricular block, first degree: Secondary | ICD-10-CM | POA: Diagnosis not present

## 2022-03-28 DIAGNOSIS — I2510 Atherosclerotic heart disease of native coronary artery without angina pectoris: Secondary | ICD-10-CM | POA: Diagnosis not present

## 2022-03-28 NOTE — Patient Instructions (Signed)
Medication Instructions:  The current medical regimen is effective;  continue present plan and medications as directed. Please refer to the Current Medication list given to you today.   *If you need a refill on your cardiac medications before your next appointment, please call your pharmacy*  Lab Work:   Testing/Procedures:  NONE    NONE If you have labs (blood work) drawn today and your tests are completely normal, you will receive your results only by: MyChart Message (if you have MyChart) OR  A paper copy in the mail If you have any lab test that is abnormal or we need to change your treatment, we will call you to review the results.  Follow-Up: Your next appointment:  12 month(s) In Person with Thomas Kelly, MD   Please call our office 2 months in advance to schedule this appointment   At [facility], you and your health needs are our priority.  As part of our continuing mission to provide you with exceptional heart care, we have created designated Provider Care Teams.  These Care Teams include your primary Cardiologist (physician) and Advanced Practice Providers (APPs -  Physician Assistants and Nurse Practitioners) who all work together to provide you with the care you need, when you need it.   Important Information About Sugar           

## 2022-03-28 NOTE — Progress Notes (Signed)
Patient ID: [name], female   DOB: 04-03-1951, 71 y.o.   MRN: [id number]       Primary M.D.: Dr. Merri Brunette   HPI: [name] is a 71 y.o. female who presents to the office for a 4 month follow-up cardiology evaluation.  [name] has a history of coronary artery disease in October 2008 underwent CABG surgery by Dr. Laneta Simmers with a LIMA to the LAD, vein to the obtuse marginal, vein to the RCA. Her last nuclear perfusion study in October 2013 was unchanged and showed mild inferior abnormality.  She has a history of moderate obstructive sleep apnea which is severe during REM sleep. She was initially started on CPAP therapy 2010 but when I  saw her in April 2014 she had not been using therapy for over the past year. At that time, I strongly advise reinstitution of CPAP therapy.  She now uses [employer] as her DME company.  She recently received a new mask, which is a full face mask.  She admits to 100% compliance.  She is unaware of breakthrough snoring.  She denies excessive daytime sleepiness.  Additional problems include hyperlipidemia, right bundle branch block, systemic lupus erythematosus, as well as obesity.  She sees Dr. Dierdre Forth for her lupus.  When I saw her in October 2017, she had experienced an episode of nonexertional chest tightness which lasted for several minutes.   She had not been very active due to bilateral deep discomfort. She had not been very active due to bilateral knee pain.  In June 2017 an echo Doppler study showed an EF of 60-65% with grade 2 diastolic dysfunction.  Over the past year, she has continued to use CPAP with 100% compliance.  She has been on aspirin.  benazapril 10 mg, HCTZ as needed, and metoprolol 50 mg twice a day.  When I saw her last year, she was changed from simvastatin to rosuvastatin for more aggressive lipid intervention.  She continues to be on rosuvastatin 20 mg for hyperlipidemia.  She underwent a nuclear perfusion study for a 10 year  follow-up evaluation in October 2018.  This remained low risk and showed a small inferobasilar defect, without associated ischemia and with akinesis of this info basal segment.  Ejection fraction was 61%.  She underwent laboratory on 10/07/2017 which showed a total cluster 148, HDL 68, LDL 66, and triglycerides 68.    In July 2020, she was evaluated in a 62-month follow-up telemedicine visit.  Since I previously seen her she had remained stable from a cardiac standpoint.  Unfortunately, her CPAP therapy stopped working last year and has not been on therapy.  She had not been sleeping as well.  She denied recurrent anginal symptomatology or shortness of breath.  She was having difficulty with bilateral knee discomfort and therefore was not able to walk well.  She continues to be followed by Dr. Dierdre Forth for lupus.  Since she had her initial sleep study in  2010 demonstrated severe sleep apnea with severe sleep apnea during REM sleep I recommended the patient to undergo a split night protocol to reassess her OSA with plan to prescribe a new CPAP machine.  I saw her in December 2020. Apparently, due to the COVID-19 pandemic she did not have her follow-up sleep evaluation.  She continued to use stationary bike for exercise.  She has been caring for her husband with dementia.    She underwent a follow-up sleep study on November 22, 2019.  This again showed  severe obstructive sleep apnea with an AHI of 45.2 and with absence of rem sleep during the diagnostic portion of the study.  She qualified for split-night protocol and CPAP titration was initiated and titrated up to 11 cm water pressure.  I saw her on Mar 29, 2020.  At that time she was again using CPAP.   I obtained a download initially from February 15 through January 19, 2020.  Usage days was 87% with average usage 7 hours and 14 minutes.  At 11 cm water pressure, AHI was 3.2.  Subsequently, her mask split and [employer] is sending a new fullface mask.  She has  felt better since reinitiating CPAP therapy.  She has more energy.  She is unaware of breakthrough snoring.  She is unaware of nocturnal palpitations.  I last saw her on November 30, 2021 after not having seen her since her May 2021 evaluation  .  Apparently, her husband was diagnosed with dementia and was waking up very frequently at night.  As result she stopped using CPAP to be available with him.  Her sleep has been poor.  She has continued to be on benazepril 10 mg, Toprol tartrate 50 mg twice a day for hypertension, Plaquenil for lupus, and rosuvastatin 20 mg for hyperlipidemia.  She admits to ankle swelling.  She feels fatigued.  She goes to bed around 1030 and wakes up around 6 but is up numerous times caring for him.  He  Since I last saw her, she continues to experience poor sleep which is undoubtedly contributed by her not using her CPAP.  She continues to care for her husband and believes she does not feel that she should get into potential deep sleep if she needs to be awake.  She continues to be on metoprolol to tartrate 50 mg twice a day and losartan HCTZ 50/12.5 mg daily for hypertension.  She is on Plaquenil 200 mg followed by Dr. Dierdre ForthBeekman for her lupus and is on rosuvastatin 20 mg daily.  She presents for evaluation  Past Medical History:  Diagnosis Date   CAD (coronary artery disease)    Hyperlipidemia    Hypertension    Obesity    OSA (obstructive sleep apnea)    RBBB    Sleep apnea    Systemic lupus erythematosus (HCC)     Past Surgical History:  Procedure Laterality Date   CARDIAC CATHETERIZATION  09/02/2007   CORONARY ANGIOPLASTY     CORONARY ARTERY BYPASS GRAFT  09/02/2007   LIMA to the LAD,SVG to obtuse marginal branch of the left CX,SVG to the RCA    Allergies  Allergen Reactions   Aspirin Nausea And Vomiting   Meloxicam Hives    Current Outpatient Medications  Medication Sig Dispense Refill   Cyanocobalamin (VITAMIN B12 PO) Take 1 tablet by mouth daily.      hydroxychloroquine (PLAQUENIL) 200 MG tablet Take by mouth daily.     losartan-hydrochlorothiazide (HYZAAR) 50-12.5 MG tablet Take 1 tablet by mouth daily. 90 tablet 3   metoprolol tartrate (LOPRESSOR) 50 MG tablet Take 1 tablet by mouth twice a day 180 tablet 3   NON FORMULARY CPAP therapy     OVER THE COUNTER MEDICATION Take 1 capsule by mouth daily. Complete omega     OVER THE COUNTER MEDICATION Take 1 tablet by mouth daily. Active Adult 50 plus     rosuvastatin (CRESTOR) 20 MG tablet TAKE 1 TABLET BY MOUTH DAILY. 90 tablet 1   No current facility-administered medications  for this visit.    Social History   Socioeconomic History   Marital status: Married    Spouse name: Not on file   Number of children: Not on file   Years of education: Not on file   Highest education level: Not on file  Occupational History   Not on file  Tobacco Use   Smoking status: Never   Smokeless tobacco: Never  Substance and Sexual Activity   Alcohol use: No    Alcohol/week: 0.0 standard drinks   Drug use: No   Sexual activity: Not on file  Other Topics Concern   Not on file  Social History Narrative   Not on file   Social Determinants of Health   Financial Resource Strain: Not on file  Food Insecurity: Not on file  Transportation Needs: Not on file  Physical Activity: Not on file  Stress: Not on file  Social Connections: Not on file  Intimate Partner Violence: Not on file   Social history is notable that she is married has one child one deceased. She does not routinely exercise. He denies alcohol use or tobacco.   Family History  Problem Relation Age of Onset   Heart disease Mother    Heart attack Mother    Hypertension Mother    Hyperlipidemia Mother    Cancer - Other Mother        breast   Heart attack Father    Heart disease Father    Hyperlipidemia Father    Hypertension Father    Cancer - Other Sister        thyroid   Diabetes Brother    Cancer - Other Sister         breast   Cancer - Other Sister        breast;  had mastectomy   Hypertension Sister    ROS General: Negative; No fevers, chills, or night sweats; Positive for morbid obesity HEENT: Negative; No changes in vision or hearing, sinus congestion, difficulty swallowing Pulmonary: Negative; No cough, wheezing, shortness of breath, hemoptysis Cardiovascular: See history of present illness Positive for ankle swelling, intermittently GI: Negative; No nausea, vomiting, diarrhea, or abdominal pain GU: Negative; No dysuria, hematuria, or difficulty voiding Musculoskeletal: Negative; no myalgias, joint pain, or weakness Rheumatologic Kylie: Positive for systemic lupus erythematosus Hematologic/Oncology: Negative; no easy bruising, bleeding Endocrine: Negative; no heat/cold intolerance; no diabetes Neuro: Negative; no changes in balance, headaches Skin: Negative; No rashes or skin lesions Psychiatric: Negative; No behavioral problems, depression Sleep: Positive for obstructive sleep apnea, ; [employer] is her DME company,no bruxism, restless legs, hypnogognic hallucinations, no cataplexy  Admits to poor sleep caring for her husband.  Epworth Sleepiness Scale score was calculated in the office today and this endorsed 5.  Other comprehensive 14 point system review is negative.   PE BP (!) 126/58   Pulse 62   Ht 5\' 1"  (1.549 m)   Wt 252 lb 9.6 oz (114.6 kg)   SpO2 97%   BMI 47.73 kg/m    Repeat blood pressure by me was 122/64.  Wt Readings from Last 3 Encounters:  03/28/22 252 lb 9.6 oz (114.6 kg)  11/30/21 247 lb 12.8 oz (112.4 kg)  03/29/20 270 lb (122.5 kg)   General: Alert, oriented, no distress.  Skin: normal turgor, no rashes, warm and dry HEENT: Normocephalic, atraumatic. Pupils equal round and reactive to light; sclera anicteric; extraocular muscles intact;  Nose without nasal septal hypertrophy Mouth/Parynx benign; Mallinpatti scale 4 Neck: No JVD,  no carotid bruits; normal carotid  upstroke Lungs: clear to ausculatation and percussion; no wheezing or rales Chest wall: without tenderness to palpitation Heart: PMI not displaced, RRR, s1 s2 normal, 1/6 systolic murmur, no diastolic murmur, no rubs, gallops, thrills, or heaves Abdomen: soft, nontender; no hepatosplenomehaly, BS+; abdominal aorta nontender and not dilated by palpation. Back: no CVA tenderness Pulses 2+ Musculoskeletal: full range of motion, normal strength, no joint deformities Extremities: no clubbing cyanosis or edema, Homan's sign negative  Neurologic: grossly nonfocal; Cranial nerves grossly wnl Psychologic: Normal mood and affect    Mar 28, 2022 ECG (independently read by me): Normal sinus rhythm at 62 bpm, isolated PVC, right bundle branch block with repolarization changes, first-degree AV block with PR 230 ms.  November 30, 2021 ECG (independently read by me): Sinus bradycardia at 57, 1st degree AV block; PR 222 msec, RBBB  Mar 29, 2021 ECG (independently read by me): Sinus rhythm with mild sinus arrhythmia.  First-degree AV block with PR of 218 ms.  Q-wave in lead III.  Bundle branch block with repolarization changes.  December 2020 ECG (independently read by me): Normal sinus rhythm at 71 bpm.  Isolated PVC.  Right bundle branch block with repolarization changes.  Inferior Q-wave in lead III.  QTc interval 447 ms.  December 2018 ECG (independently read by me): Normal sinus rhythm at 72 bpm.  Right bundle branch block with repolarization changes.  October 2018 ECG (independently read by me): Normal sinus rhythm at 73 bpm.  Right bundle branch block with repolarization changes.  QTc interval 460 ms.  June 2017 ECG (independently read by me): Normal sinus rhythm at 65 bpm.  Right bundle-branch block with repolarization changes.  Small nondiagnostic inferior Q waves.  T-wave abnormality laterally.  October 2016 ECG (independently read by me, and (: Normal sinus rhythm at 71 beats per minute.  Right  bundle branch block with repolarization changes.  Q wave in lead 3.  November 2014 ECG: Sinus rhythm with right bundle branch block. First degree AV block with a PR interval of 208 ms   LABS:     Latest Ref Rng & Units 12/21/2021   10:59 AM 10/07/2017    2:00 PM 09/17/2013    9:56 AM  BMP  Glucose 70 - 99 mg/dL 94   91   767    BUN 8 - 27 mg/dL 17   10   14     Creatinine 0.57 - 1.00 mg/dL   3.41   9.37    BUN/Creat Ratio 12 - 28 20   13      Sodium 134 - 144 mmol/L 138   144   138    Potassium 3.5 - 5.2 mmol/L 4.3   4.5   4.0    Chloride 96 - 106 mmol/L 99   105   104    CO2 20 - 29 mmol/L 25   24   26     Calcium 8.7 - 10.3 mg/dL 9.8   9.6   9.5         Component Value Date/Time   PROT 8.2 12/21/2021 1059   ALBUMIN 4.4 12/21/2021 1059   AST 40 12/21/2021 1059   ALT 26 12/21/2021 1059   ALKPHOS 90 12/21/2021 1059   BILITOT 0.4 12/21/2021 1059        Latest Ref Rng & Units 12/21/2021   10:59 AM 10/07/2017    2:00 PM 09/17/2013    9:56 AM  CBC  WBC 3.4 -  10.8 x10E3/uL 5.3   5.8   5.2    Hemoglobin 11.1 - 15.9 g/dL 16.1   09.6   04.5    Hematocrit 34.0 - 46.6 % 44.8   41.7   43.2    Platelets 150 - 450 x10E3/uL 168   228   205         Component Value Date/Time   PROBNP 858.0 (H) 09/09/2007 0340       Component Value Date/Time   CHOL 153 12/21/2021 1059   TRIG 67 12/21/2021 1059   HDL 70 12/21/2021 1059   CHOLHDL 2.2 12/21/2021 1059   CHOLHDL 2.4 02/04/2015 0957   VLDL 20 02/04/2015 0957   LDLCALC 70 12/21/2021 1059     RADIOLOGY: No results found.  IMPRESSION:  1. CAD in native artery   2. S/P CABG (coronary artery bypass graft)   3. OSA (obstructive sleep apnea)   4. Primary hypertension   5. Hyperlipidemia with target LDL less than 70   6. Morbid obesity (HCC)   7. RBBB   8. First degree heart block      ASSESSMENT AND PLAN: [name] is a 71 year old female with known CAD who underwent CABG revascularization surgery in October 2008.   Her prior nuclear studies have shown mild inferior abnormality.  Her last nuclear perfusion study again showed a probable small region of inferobasal scar without associated ischemia.  The study was low risk.  Global ejection fraction was 61%.  She previously developed ankle edema and with blood pressure elevation, she was changed to losartan HCT 50/12.5 mg to take in addition to metoprolol tartrate 50 mg twice a day both for blood pressure control and edema management.  Her BP today is stable and there is no significant edema.  She has been caring for her ill husband.  I again had a very long discussion with her and suggested in light of her previously documented severe obstructive sleep apnea with an AHI of 45.2 and oxygen desaturation that she would benefit from at least reinstituting CPAP for some of the night.  I am changing her parameters and instead of a set pressure at 11 I will change her to a auto mode with a range of 10-16 and a ramp start pressure at 6 and we will continue her in the auto mode.  I discussed with her that the preponderance of deep REM sleep occurs in the second half of the night and will be important for her to try to obtain some restorative sleep.  She continues to be on Plaquenil for her lupus followed by Dr. Dierdre Forth.  She has first-degree block and right bundle branch block which is stable.  She continues to be morbidly obese with BMI 47.7 and weight loss and increased activity was recommended.  She sees Dr. Merri Brunette for primary care.  I will see her in 1 year for reevaluation or sooner as needed   Lennette Bihari, MD, [facility]  04/04/2022 10:36 AM

## 2022-04-04 ENCOUNTER — Encounter: Payer: Self-pay | Admitting: Cardiovascular Disease

## 2022-05-30 DIAGNOSIS — M65331 Trigger finger, right middle finger: Secondary | ICD-10-CM | POA: Diagnosis not present

## 2022-05-30 DIAGNOSIS — M170 Bilateral primary osteoarthritis of knee: Secondary | ICD-10-CM | POA: Diagnosis not present

## 2022-05-30 DIAGNOSIS — M79604 Pain in right leg: Secondary | ICD-10-CM | POA: Diagnosis not present

## 2022-05-30 DIAGNOSIS — M329 Systemic lupus erythematosus, unspecified: Secondary | ICD-10-CM | POA: Diagnosis not present

## 2022-05-30 DIAGNOSIS — R899 Unspecified abnormal finding in specimens from other organs, systems and tissues: Secondary | ICD-10-CM | POA: Diagnosis not present

## 2022-05-30 DIAGNOSIS — Z6841 Body Mass Index (BMI) 40.0 and over, adult: Secondary | ICD-10-CM | POA: Diagnosis not present

## 2022-06-18 DIAGNOSIS — Z0000 Encounter for general adult medical examination without abnormal findings: Secondary | ICD-10-CM | POA: Diagnosis not present

## 2022-06-18 DIAGNOSIS — M329 Systemic lupus erythematosus, unspecified: Secondary | ICD-10-CM | POA: Diagnosis not present

## 2022-06-18 DIAGNOSIS — Z1331 Encounter for screening for depression: Secondary | ICD-10-CM | POA: Diagnosis not present

## 2022-06-18 DIAGNOSIS — E7800 Pure hypercholesterolemia, unspecified: Secondary | ICD-10-CM | POA: Diagnosis not present

## 2022-06-18 DIAGNOSIS — I10 Essential (primary) hypertension: Secondary | ICD-10-CM | POA: Diagnosis not present

## 2022-06-18 DIAGNOSIS — I2510 Atherosclerotic heart disease of native coronary artery without angina pectoris: Secondary | ICD-10-CM | POA: Diagnosis not present

## 2022-07-12 DIAGNOSIS — G4733 Obstructive sleep apnea (adult) (pediatric): Secondary | ICD-10-CM | POA: Diagnosis not present

## 2022-07-12 DIAGNOSIS — Z1211 Encounter for screening for malignant neoplasm of colon: Secondary | ICD-10-CM | POA: Diagnosis not present

## 2022-07-12 DIAGNOSIS — K5730 Diverticulosis of large intestine without perforation or abscess without bleeding: Secondary | ICD-10-CM | POA: Diagnosis not present

## 2022-07-12 DIAGNOSIS — E782 Mixed hyperlipidemia: Secondary | ICD-10-CM | POA: Diagnosis not present

## 2022-07-12 DIAGNOSIS — I2510 Atherosclerotic heart disease of native coronary artery without angina pectoris: Secondary | ICD-10-CM | POA: Diagnosis not present

## 2022-07-12 DIAGNOSIS — I10 Essential (primary) hypertension: Secondary | ICD-10-CM | POA: Diagnosis not present

## 2022-07-17 ENCOUNTER — Telehealth: Payer: Self-pay | Admitting: *Deleted

## 2022-07-17 NOTE — Telephone Encounter (Signed)
   Pre-operative Risk Assessment    Patient Name: [name]  DOB: 09/11/1951 MRN: [id number]      Request for Surgical Clearance    Procedure:   COLONOSCOPY  Date of Surgery:  Clearance 10/12/22                                 Surgeon:  DR. MANN Surgeon's Group or Practice Name:  [facility] Phone number:  [phone] Fax number:  [phone]   Type of Clearance Requested:   - Medical ; NO MEDICATIONS LISTED AS NEEDING TO BE HELD    Type of Anesthesia:   PROPOFOL   Additional requests/questions:    [name]   07/17/2022, 1:14 PM

## 2022-07-19 NOTE — Telephone Encounter (Signed)
    Patient Name: [name]  DOB: January 09, 1951 MRN: [id number]  Primary Cardiologist: Nicki Guadalajara, MD  Chart reviewed as part of pre-operative protocol coverage.   The patient already has an upcoming appointment scheduled 08/06/22 with Dr. Cristal Deer at which time this clearance should be addressed. (Procedure date of 10/12/22 falls after appointment.)  - No anticoags on MAR listed to hold  - I added "preop" comment to appointment notes so that provider is aware to address at time of OV. Per office protocol, the provider seeing this patient should forward their finalized clearance decision to requesting party below.  - Will fax update to requesting surgeon so they are aware. Will remove from preop box as separate preop APP input not necessary at this time.  Laurann Montana, PA-C 07/19/2022, 12:22 PM

## 2022-07-25 NOTE — Telephone Encounter (Signed)
Accidentally re-routed back to preop app box, will remove as pt has upcoming preop OV at which time this will be addressed.

## 2022-08-06 ENCOUNTER — Ambulatory Visit (HOSPITAL_BASED_OUTPATIENT_CLINIC_OR_DEPARTMENT_OTHER): Payer: PPO | Admitting: Cardiology

## 2022-08-24 DIAGNOSIS — E7800 Pure hypercholesterolemia, unspecified: Secondary | ICD-10-CM | POA: Diagnosis not present

## 2022-09-07 ENCOUNTER — Ambulatory Visit (HOSPITAL_BASED_OUTPATIENT_CLINIC_OR_DEPARTMENT_OTHER): Payer: PPO | Admitting: Cardiology

## 2022-10-01 ENCOUNTER — Encounter (HOSPITAL_BASED_OUTPATIENT_CLINIC_OR_DEPARTMENT_OTHER): Payer: Self-pay | Admitting: Cardiology

## 2022-10-01 ENCOUNTER — Ambulatory Visit (HOSPITAL_BASED_OUTPATIENT_CLINIC_OR_DEPARTMENT_OTHER): Payer: PPO | Admitting: Cardiology

## 2022-10-01 VITALS — BP 130/68 | HR 62 | Ht 61.0 in | Wt 246.4 lb

## 2022-10-01 DIAGNOSIS — I2510 Atherosclerotic heart disease of native coronary artery without angina pectoris: Secondary | ICD-10-CM

## 2022-10-01 DIAGNOSIS — Z951 Presence of aortocoronary bypass graft: Secondary | ICD-10-CM

## 2022-10-01 DIAGNOSIS — E785 Hyperlipidemia, unspecified: Secondary | ICD-10-CM

## 2022-10-01 DIAGNOSIS — Z01810 Encounter for preprocedural cardiovascular examination: Secondary | ICD-10-CM

## 2022-10-01 DIAGNOSIS — G4733 Obstructive sleep apnea (adult) (pediatric): Secondary | ICD-10-CM

## 2022-10-01 DIAGNOSIS — I10 Essential (primary) hypertension: Secondary | ICD-10-CM

## 2022-10-01 DIAGNOSIS — I4510 Unspecified right bundle-branch block: Secondary | ICD-10-CM | POA: Diagnosis not present

## 2022-10-01 NOTE — Patient Instructions (Signed)
Medication Instructions:  The current medical regimen is effective;  continue present plan and medications.   *If you need a refill on your cardiac medications before your next appointment, please call your pharmacy*   Lab Work: None   Testing/Procedures: None   Follow-Up: At [facility], you and your health needs are our priority.  As part of our continuing mission to provide you with exceptional heart care, we have created designated Provider Care Teams.  These Care Teams include your primary Cardiologist (physician) and Advanced Practice Providers (APPs -  Physician Assistants and Nurse Practitioners) who all work together to provide you with the care you need, when you need it.  We recommend signing up for the patient portal called "MyChart".  Sign up information is provided on this After Visit Summary.  MyChart is used to connect with patients for Virtual Visits (Telemedicine).  Patients are able to view lab/test results, encounter notes, upcoming appointments, etc.  Non-urgent messages can be sent to your provider as well.   To learn more about what you can do with MyChart, go to [email or web address].    Your next appointment:   6 month(s)  The format for your next appointment:   In Person  Provider:   Bridgette Christopher, MD    Other Instructions None       

## 2022-10-01 NOTE — Progress Notes (Signed)
Cardiology Office Note:    Date:  10/01/2022   ID:  [name], DOB [birth year], MRN [id number]  PCP:  Merri Brunette, MD  Cardiologist:  Jodelle Red, MD  Referring MD: Merri Brunette, MD   CC: New patient evaluation for preop clearance   History of Present Illness:    [name] is a 71 y.o. female with a hx of CAD s/p CABG surgery by Dr. Laneta Simmers 08/2007 with a LIMA to the LAD, vein to the obtuse marginal, vein to the RCA, hypertension, hyperlipidemia, systemic lupus erythematosus, OSA, and obesity, who is seen as a new consult at the request of Merri Brunette, MD for preoperative clearance.  She was last seen in Cardiology by Dr. Tresa Endo on 03/28/2022. At that visit she continued to care for her husband who has dementia. She was not using her CPAP or sleeping well. Her EKG showed normal sinus rhythm at 62 bpm, isolated PVC, right bundle branch block with repolarization changes, first-degree AV block with PR 230 ms. She continued to be on Plaquenil for her lupus followed by Dr. Dierdre Forth.   Planned surgery: Colonoscopy to be performed 10/12/2022 by Dr. Loreta Ave. Will receive propofol anesthesia. No medications requested to be held.   Pertinent past cardiac history: Prior cardiac workup:  Stress test 08/2017 revealed no ischemia, low-risk nuclear study, small inferobasal defect, EF 61%.  Echocardiogram  04/2016 showing normal LV function with grade 2 diastolic dysfunction, and no significant valvular abnormalities.  History of valve disease: No History of CAD/PAD/CVA/TIA:  CAD s/p CABG surgery by Dr. Laneta Simmers with a LIMA to the LAD, vein to the obtuse marginal, vein to the RCA History of heart failure: No History of arrhythmia: No On anticoagulation: No. She is not taking 81 mg ASA as it causes stomach upset. History of hypertension: Yes. She does not monitor her blood pressures at home. Her blood pressure was 142/72 in clinic which she attributes to rushing to her appointment  today. On recheck it improved to 130/68. History of diabetes: No History of CKD:  No History of OSA:  Yes. ?Currently compliant with CPAP. History of anesthesia complications: None. Current symptoms: Sometimes she feels a little lightheaded, but she attributes this to her stress. Last month her husband passed away, offered my condolences.  Functional capacity: Previously the main caretaker for her husband for the past year. She had frequently needed to help lift him. She has a few stairs leading up into her home. Typically she is able to complete her ADL's, She can walk 1-2 blocks, and can walk up a hill or flight of stairs if she takes her time. She denies being able to go running for a short distance. She confirms being able to complete light, moderate, and heavy house work. If she needs to she could complete yard work as well. She denies being able to participate in strenuous sports.   She is considering following the Weight Watcher's program. She would also like to return to water aerobics.  She denies any palpitations, chest pain, shortness of breath, or peripheral edema. No headaches, syncope, orthopnea, or PND.  Past Medical History:  Diagnosis Date   CAD (coronary artery disease)    Hyperlipidemia    Hypertension    Obesity    OSA (obstructive sleep apnea)    RBBB    Sleep apnea    Systemic lupus erythematosus (HCC)     Past Surgical History:  Procedure Laterality Date   CARDIAC CATHETERIZATION  09/02/2007   CORONARY  ANGIOPLASTY     CORONARY ARTERY BYPASS GRAFT  09/02/2007   LIMA to the LAD,SVG to obtuse marginal branch of the left CX,SVG to the RCA    Current Medications: Current Outpatient Medications on File Prior to Visit  Medication Sig   Cyanocobalamin (VITAMIN B12 PO) Take 1 tablet by mouth daily.   hydroxychloroquine (PLAQUENIL) 200 MG tablet Take by mouth daily.   losartan-hydrochlorothiazide (HYZAAR) 50-12.5 MG tablet Take 1 tablet by mouth daily.   metoprolol  tartrate (LOPRESSOR) 50 MG tablet Take 1 tablet by mouth twice a day   NON FORMULARY CPAP therapy   OVER THE COUNTER MEDICATION Take 1 capsule by mouth daily. Complete omega   OVER THE COUNTER MEDICATION Take 1 tablet by mouth daily. Active Adult 50 plus   rosuvastatin (CRESTOR) 20 MG tablet TAKE 1 TABLET BY MOUTH DAILY.   No current facility-administered medications on file prior to visit.     Allergies:   Aspirin and Meloxicam   Social History   Tobacco Use   Smoking status: Never   Smokeless tobacco: Never  Substance Use Topics   Alcohol use: No    Alcohol/week: 0.0 standard drinks of alcohol   Drug use: No    Family History: family history includes Cancer - Other in her mother, sister, sister, and sister; Diabetes in her brother; Heart attack in her father and mother; Heart disease in her father and mother; Hyperlipidemia in her father and mother; Hypertension in her father, mother, and sister.  ROS:   Please see the history of present illness.  Additional pertinent ROS: Constitutional: Negative for chills, fever, night sweats, unintentional weight loss  HENT: Negative for ear pain and hearing loss.   Eyes: Negative for loss of vision and eye pain.  Respiratory: Negative for cough, sputum, wheezing.   Cardiovascular: See HPI. Gastrointestinal: Negative for abdominal pain, melena, and hematochezia.  Genitourinary: Negative for dysuria and hematuria.  Musculoskeletal: Negative for falls and myalgias.  Skin: Negative for itching and rash.  Neurological: Negative for focal weakness, focal sensory changes and loss of consciousness.  Endo/Heme/Allergies: Does not bruise/bleed easily.     EKGs/Labs/Other Studies Reviewed:    The following studies were reviewed today:  Lexiscan Stress Test  08/23/2017: The left ventricular ejection fraction is normal (55-65%). Nuclear stress EF: 61%. There was no ST segment deviation noted during stress. Defect 1: There is a small defect  of severe severity. This is a low risk study.   Low risk stress nuclear study with fixed inferobasal defect suggestive of prior infarct; no ischemia; EF 61 with akinesis of the inferobasal wall.  Echo  05/03/2016: Study Conclusions   - Left ventricle: The cavity size was normal. Wall thickness was    normal. Systolic function was normal. The estimated ejection    fraction was in the range of 60% to 65%. Wall motion was normal;    there were no regional wall motion abnormalities. Features are    consistent with a pseudonormal left ventricular filling pattern,    with concomitant abnormal relaxation and increased filling    pressure (grade 2 diastolic dysfunction).  - Aortic valve: There was no stenosis.  - Mitral valve: There was no significant regurgitation.  - Right ventricle: Poorly visualized. The cavity size was normal.    Systolic function was normal.  - Pulmonary arteries: No complete TR doppler jet so unable to    estimate PA systolic pressure.  - Inferior vena cava: The vessel was normal in size. The  respirophasic diameter changes were in the normal range (= 50%),    consistent with normal central venous pressure.   Impressions:   - Normal LV size and systolic function, EF 60-65%. Moderate    diastolic dysfunction. Normal RV size and systolic function. No    significant valvular abnormalities.    EKG:  EKG is personally reviewed.   10/01/2022:  SR with 1st degree AV block, 1 PVC, 1 aberrant conduction, RBBB  Recent Labs: 12/21/2021: ALT 26; BUN 17; Creatinine, Ser 0.85; Hemoglobin 14.4; Platelets 168; Potassium 4.3; Sodium 138   Recent Lipid Panel    Component Value Date/Time   CHOL 153 12/21/2021 1059   TRIG 67 12/21/2021 1059   HDL 70 12/21/2021 1059   CHOLHDL 2.2 12/21/2021 1059   CHOLHDL 2.4 02/04/2015 0957   VLDL 20 02/04/2015 0957   LDLCALC 70 12/21/2021 1059    Physical Exam:    VS:  BP 130/68 (BP Location: Right Arm, Patient Position: Sitting, Cuff  Size: Large)   Pulse 62   Ht 5\' 1"  (1.549 m)   Wt 246 lb 6.4 oz (111.8 kg)   SpO2 97%   BMI 46.56 kg/m     Wt Readings from Last 3 Encounters:  10/01/22 246 lb 6.4 oz (111.8 kg)  03/28/22 252 lb 9.6 oz (114.6 kg)  11/30/21 247 lb 12.8 oz (112.4 kg)    GEN: Well nourished, well developed in no acute distress HEENT: Normal, moist mucous membranes NECK: No JVD CARDIAC: regular rhythm, normal S1 and S2, no rubs or gallops. No murmur. VASCULAR: Radial and DP pulses 2+ bilaterally. No carotid bruits RESPIRATORY:  Clear to auscultation without rales, wheezing or rhonchi  ABDOMEN: Soft, non-tender, non-distended MUSCULOSKELETAL:  Ambulates independently SKIN: Warm and dry, no edema NEUROLOGIC:  Alert and oriented x 3. No focal neuro deficits noted. PSYCHIATRIC:  Normal affect    ASSESSMENT:    1. Preop cardiovascular exam   2. CAD in native artery   3. S/P CABG (coronary artery bypass graft)   4. Primary hypertension   5. Hyperlipidemia with target LDL less than 70   6. RBBB   7. Morbid obesity (HCC)   8. OSA (obstructive sleep apnea)    PLAN:    Preoperative cardiovascular examination According to the Revised Cardiac Risk Index (RCRI), her Perioperative Risk of Major Cardiac Event is (%): 0.9  The patient is not currently having active cardiac symptoms, and they can achieve >4 METs of activity. Based on the Duke Activity Status Index, her functional capacity is 6.6 METs.  According to ACC/AHA Guidelines, no further testing is needed.  Proceed with surgery at acceptable risk.  Our service is available as needed in the peri-operative period.     Coronary artery disease History of CABG in 08/2007 Hypercholesterolemia -no angina -no change in functional capacity -states she is not on an aspirin due to GI upset -continue rosuvastatin 20 mg daily. Most recent LDL 68  Hypertension -continue losartan-HCTZ, metoprolol 50 mg BID.  OSA: on CPAP  RBBB: chronic  Obesity: BMI  46. She has just lost her husband last month. She does have family for support. She is going to gradually work on increasing her activity level and plans to look into weight watchers for weight loss  Cardiac risk counseling and prevention recommendations: -recommend heart healthy/Mediterranean diet, with whole grains, fruits, vegetable, fish, lean meats, nuts, and olive oil. Limit salt. -recommend moderate walking, 3-5 times/week for 30-50 minutes each session. Aim for at least 150  minutes.week. Goal should be pace of 3 miles/hours, or walking 1.5 miles in 30 minutes -recommend avoidance of tobacco products. Avoid excess alcohol. -ASCVD risk score: The 10-year ASCVD risk score (Arnett DK, et al., 2019) is: 10.6%   Values used to calculate the score:     Age: 67 years     Sex: Female     Is Non-Hispanic African American: Yes     Diabetic: No     Tobacco smoker: No     Systolic Blood Pressure: 130 mmHg     Is BP treated: Yes     HDL Cholesterol: 70 mg/dL     Total Cholesterol: 153 mg/dL    Plan for follow up: 6 months or sooner as needed.  Jodelle Red, MD, PhD, [facility] [place]  [facility]    Medication Adjustments/Labs and Tests Ordered: Current medicines are reviewed at length with the patient today.  Concerns regarding medicines are outlined above.   Orders Placed This Encounter  Procedures   EKG 12-Lead   No orders of the defined types were placed in this encounter.  Patient Instructions  Medication Instructions:  The current medical regimen is effective;  continue present plan and medications.   *If you need a refill on your cardiac medications before your next appointment, please call your pharmacy*   Lab Work: None  Testing/Procedures: None   Follow-Up: At [facility], you and your health needs are our priority.  As part of our continuing mission to provide you with exceptional heart care, we have created designated Provider Care Teams.   These Care Teams include your primary Cardiologist (physician) and Advanced Practice Providers (APPs -  Physician Assistants and Nurse Practitioners) who all work together to provide you with the care you need, when you need it.  We recommend signing up for the patient portal called "MyChart".  Sign up information is provided on this After Visit Summary.  MyChart is used to connect with patients for Virtual Visits (Telemedicine).  Patients are able to view lab/test results, encounter notes, upcoming appointments, etc.  Non-urgent messages can be sent to your provider as well.   To learn more about what you can do with MyChart, go to [email or web address].    Your next appointment:   6 month(s)  The format for your next appointment:   In Person  Provider:   Jodelle Red, MD   Other Instructions None       I,Mathew Stumpf,acting as a scribe for Jodelle Red, MD.,have documented all relevant documentation on the behalf of Jodelle Red, MD,as directed by  Jodelle Red, MD while in the presence of Jodelle Red, MD.  I, Jodelle Red, MD, have reviewed all documentation for this visit. The documentation on 10/01/22 for the exam, diagnosis, procedures, and orders are all accurate and complete.   Signed, Jodelle Red, MD PhD 10/01/2022 12:41 PM    [facility]

## 2022-10-19 DIAGNOSIS — K5730 Diverticulosis of large intestine without perforation or abscess without bleeding: Secondary | ICD-10-CM | POA: Diagnosis not present

## 2022-10-19 DIAGNOSIS — Z1211 Encounter for screening for malignant neoplasm of colon: Secondary | ICD-10-CM | POA: Diagnosis not present

## 2022-11-02 DIAGNOSIS — E039 Hypothyroidism, unspecified: Secondary | ICD-10-CM | POA: Diagnosis not present

## 2022-11-09 ENCOUNTER — Telehealth (HOSPITAL_BASED_OUTPATIENT_CLINIC_OR_DEPARTMENT_OTHER): Payer: Self-pay | Admitting: Cardiology

## 2022-11-09 MED ORDER — LOSARTAN POTASSIUM-HCTZ 50-12.5 MG PO TABS: 1.0000 | ORAL_TABLET | Freq: Every day | ORAL | 3 refills | Status: DC

## 2022-11-09 NOTE — Telephone Encounter (Signed)
Rx request sent to pharmacy.  

## 2022-11-09 NOTE — Telephone Encounter (Signed)
*  STAT* If patient is at the pharmacy, call can be transferred to refill team.   1. Which medications need to be refilled? (please list name of each medication and dose if known) need new prescriptions for Losartan-Hydrochlorothiazide - changing pharmacy  2. Which pharmacy/location (including street and city if local pharmacy) is medication to be sent to?[facility]  3. Do they need a 30 day or 90 day supply? 90 days and refills

## 2023-01-28 ENCOUNTER — Telehealth (HOSPITAL_BASED_OUTPATIENT_CLINIC_OR_DEPARTMENT_OTHER): Payer: Self-pay | Admitting: Cardiology

## 2023-01-28 ENCOUNTER — Other Ambulatory Visit: Payer: Self-pay

## 2023-01-28 DIAGNOSIS — I10 Essential (primary) hypertension: Secondary | ICD-10-CM

## 2023-01-28 MED ORDER — METOPROLOL TARTRATE 50 MG PO TABS: ORAL_TABLET | ORAL | 3 refills | Status: DC

## 2023-01-28 MED ORDER — METOPROLOL TARTRATE 50 MG PO TABS: ORAL_TABLET | ORAL | 2 refills | Status: DC

## 2023-01-28 NOTE — Telephone Encounter (Signed)
*  STAT* If patient is at the pharmacy, call can be transferred to refill team.   1. Which medications need to be refilled? (please list name of each medication and dose if known) metoprolol tartrate (LOPRESSOR) 50 MG tablet   2. Which pharmacy/location (including street and city if local pharmacy) is medication to be sent to? [facility] [place] [place] [facility]   3. Do they need a 30 day or 90 day supply? 90 day  Pt will still be [facility] for Metoprolol. She has 5 days left of this medication.

## 2023-01-28 NOTE — Telephone Encounter (Signed)
Rx request sent to pharmacy.  

## 2023-03-15 ENCOUNTER — Other Ambulatory Visit: Payer: Self-pay | Admitting: Family Medicine

## 2023-03-15 DIAGNOSIS — R928 Other abnormal and inconclusive findings on diagnostic imaging of breast: Secondary | ICD-10-CM

## 2023-04-02 ENCOUNTER — Ambulatory Visit (HOSPITAL_BASED_OUTPATIENT_CLINIC_OR_DEPARTMENT_OTHER): Payer: PPO | Admitting: Cardiology

## 2023-04-05 ENCOUNTER — Ambulatory Visit
Admission: RE | Admit: 2023-04-05 | Discharge: 2023-04-05 | Disposition: A | Discharge status: Home / Self Care | Payer: Medicare HMO | Source: Ambulatory Visit | Attending: Family Medicine | Admitting: Family Medicine

## 2023-04-05 DIAGNOSIS — R928 Other abnormal and inconclusive findings on diagnostic imaging of breast: Secondary | ICD-10-CM

## 2023-05-06 ENCOUNTER — Encounter (HOSPITAL_BASED_OUTPATIENT_CLINIC_OR_DEPARTMENT_OTHER): Payer: Self-pay

## 2023-05-06 ENCOUNTER — Other Ambulatory Visit (HOSPITAL_BASED_OUTPATIENT_CLINIC_OR_DEPARTMENT_OTHER): Payer: Self-pay

## 2023-05-06 ENCOUNTER — Ambulatory Visit (HOSPITAL_BASED_OUTPATIENT_CLINIC_OR_DEPARTMENT_OTHER): Payer: Medicare HMO | Admitting: Cardiology

## 2023-05-06 ENCOUNTER — Telehealth (HOSPITAL_BASED_OUTPATIENT_CLINIC_OR_DEPARTMENT_OTHER): Payer: Self-pay

## 2023-05-06 ENCOUNTER — Encounter (HOSPITAL_BASED_OUTPATIENT_CLINIC_OR_DEPARTMENT_OTHER): Payer: Self-pay | Admitting: Cardiology

## 2023-05-06 VITALS — BP 130/58 | HR 74 | Ht 61.0 in | Wt 243.0 lb

## 2023-05-06 DIAGNOSIS — I10 Essential (primary) hypertension: Secondary | ICD-10-CM

## 2023-05-06 DIAGNOSIS — Z951 Presence of aortocoronary bypass graft: Secondary | ICD-10-CM

## 2023-05-06 DIAGNOSIS — E785 Hyperlipidemia, unspecified: Secondary | ICD-10-CM | POA: Diagnosis not present

## 2023-05-06 DIAGNOSIS — I2510 Atherosclerotic heart disease of native coronary artery without angina pectoris: Secondary | ICD-10-CM

## 2023-05-06 MED ORDER — SEMAGLUTIDE-WEIGHT MANAGEMENT 1.7 MG/0.75ML ~~LOC~~ SOAJ
1.7000 mg | SUBCUTANEOUS | 0 refills | Status: DC
  Filled 2023-05-06: qty 3, 28d supply, fill #0

## 2023-05-06 MED ORDER — SEMAGLUTIDE-WEIGHT MANAGEMENT 1 MG/0.5ML ~~LOC~~ SOAJ
1.0000 mg | SUBCUTANEOUS | 0 refills | Status: AC
  Filled 2023-05-06: qty 2, 28d supply, fill #0

## 2023-05-06 MED ORDER — SEMAGLUTIDE-WEIGHT MANAGEMENT 0.25 MG/0.5ML ~~LOC~~ SOAJ
0.2500 mg | SUBCUTANEOUS | 0 refills | Status: AC
  Filled 2023-05-06 – 2023-05-12 (×2): qty 2, 28d supply, fill #0

## 2023-05-06 MED ORDER — SEMAGLUTIDE-WEIGHT MANAGEMENT 0.5 MG/0.5ML ~~LOC~~ SOAJ
0.5000 mg | SUBCUTANEOUS | 0 refills | Status: AC
  Filled 2023-05-06: qty 2, 28d supply, fill #0

## 2023-05-06 MED ORDER — SEMAGLUTIDE-WEIGHT MANAGEMENT 2.4 MG/0.75ML ~~LOC~~ SOAJ
2.4000 mg | SUBCUTANEOUS | 9 refills | Status: DC
  Filled 2023-05-06: qty 3, fill #0

## 2023-05-06 NOTE — Progress Notes (Signed)
Cardiology Office Note:  .    Date:  05/06/2023  ID:  [name], DOB 1951-03-05, MRN [id number] PCP: Merri Brunette, MD  [facility] Providers Cardiologist:  Jodelle Red, MD     History of Present Illness: [name] Kitchen    [name] is a 72 y.o. female with a hx of CAD s/p CABG surgery by Dr. Laneta Simmers 08/2007 with a LIMA to the LAD, vein to the obtuse marginal, vein to the RCA, hypertension, hyperlipidemia, systemic lupus erythematosus, OSA, and obesity, who presents for follow-up today. She was initially seen 10/01/2022 as a new consult at the request of Merri Brunette, MD for preoperative clearance.   Prior cardiac history:   -Stress test 08/2017 revealed no ischemia, low-risk nuclear study, small inferobasal defect, EF 61%.  Echocardiogram  04/2016 showing normal LV function with grade 2 diastolic dysfunction, and no significant valvular abnormalities.  -CAD s/p CABG surgery by Dr. Laneta Simmers with a LIMA to the LAD, vein to the obtuse marginal, vein to the RCA in 2008  Today, she reports a recent episode that initially began as a fluttering in her central chest, then moved and progressed to a constant nagging pain above the left breast. No associated shortness of breath, no chest pressure as if something was sitting on her chest. Eventually this resolved but returned the next day. She tried drinking ginger tea with improvement in her symptoms. Of note, at one time she was lying down on her left side while experiencing the chest pain. There was also improvement in her pain after she shifted to her right side. Lately, her most strenuous activity has been running after her 56 year old great granddaughter.   In the office her blood pressure is 130/58. She notes that she went the wrong way and was late on her way to this visit.  Struggles with not eating. She notes that she could avoid eating all day. We discussed Wegovy and alternatives at length and provided instructions for proper  use of injections.  She denies any shortness of breath, peripheral edema, lightheadedness, headaches, syncope, orthopnea, or PND.  ROS:  Please see the history of present illness. ROS otherwise negative except as noted.  (+) Palpitations (+) Chest pain  Studies Reviewed: [name] Kitchen       ECG not ordered today  Physical Exam:    VS:  BP (!) 130/58   Pulse 74   Ht 5\' 1"  (1.549 m)   Wt 243 lb (110.2 kg)   SpO2 95%   BMI 45.91 kg/m    Wt Readings from Last 3 Encounters:  05/06/23 243 lb (110.2 kg)  10/01/22 246 lb 6.4 oz (111.8 kg)  03/28/22 252 lb 9.6 oz (114.6 kg)    GEN: Well nourished, well developed in no acute distress HEENT: Normal, moist mucous membranes NECK: No JVD CARDIAC: regular rhythm, normal S1 and S2, no rubs or gallops. No murmur. VASCULAR: Radial and DP pulses 2+ bilaterally. No carotid bruits RESPIRATORY:  Clear to auscultation without rales, wheezing or rhonchi  ABDOMEN: Soft, non-tender, non-distended MUSCULOSKELETAL:  Ambulates independently SKIN: Warm and dry, no edema NEUROLOGIC:  Alert and oriented x 3. No focal neuro deficits noted. PSYCHIATRIC:  Normal affect   ASSESSMENT AND PLAN: .    Coronary artery disease History of CABG in 08/2007 Hypercholesterolemia -no angina -no change in functional capacity -states she is not on an aspirin due to GI upset -continue rosuvastatin 20 mg daily. Most recent LDL 68 -we discussed semaglutide for secondary  prevention today. Discussed risks/benefits. She is amenable. Teaching done today.   Hypertension -continue losartan-HCTZ, metoprolol 50 mg BID.   OSA: on CPAP   RBBB: chronic   Obesity: BMI 46.  -discussed weight loss, diet, exercise today. Reginal Lutes is not primarily for weight loss (for secondary prevention, as above) but should have the benefit of some weight loss as well  Cardiac risk counseling and prevention recommendations: -recommend heart healthy/Mediterranean diet, with whole grains, fruits,  vegetable, fish, lean meats, nuts, and olive oil. Limit salt. -recommend moderate walking, 3-5 times/week for 30-50 minutes each session. Aim for at least 150 minutes.week. Goal should be pace of 3 miles/hours, or walking 1.5 miles in 30 minutes -recommend avoidance of tobacco products. Avoid excess alcohol.  Dispo: Follow-up in 3 months, or sooner as needed.  I,Mathew Stumpf,acting as a Neurosurgeon for [employer], MD.,have documented all relevant documentation on the behalf of Jodelle Red, MD,as directed by  Jodelle Red, MD while in the presence of Jodelle Red, MD.  I, Jodelle Red, MD, have reviewed all documentation for this visit. The documentation on 05/06/23 for the exam, diagnosis, procedures, and orders are all accurate and complete.   Signed, Jodelle Red, MD

## 2023-05-06 NOTE — Patient Instructions (Addendum)
Medication Instructions:  Semaglutide starting dose plan: -Start with the 0.25 mg dose every week for 4 weeks, -Then use the 0.5 mg dose every week for 4 weeks, -Then continue with 1 mg dose every week. We will meet when you are on the 1 mg dose to discuss timing of uptitration. *If you need a refill on your cardiac medications before your next appointment, please call your pharmacy*   Lab Work: None Ordered   Testing/Procedures: None Ordered   Follow-Up: At [facility], you and your health needs are our priority.  As part of our continuing mission to provide you with exceptional heart care, we have created designated Provider Care Teams.  These Care Teams include your primary Cardiologist (physician) and Advanced Practice Providers (APPs -  Physician Assistants and Nurse Practitioners) who all work together to provide you with the care you need, when you need it.  We recommend signing up for the patient portal called "MyChart".  Sign up information is provided on this After Visit Summary.  MyChart is used to connect with patients for Virtual Visits (Telemedicine).  Patients are able to view lab/test results, encounter notes, upcoming appointments, etc.  Non-urgent messages can be sent to your provider as well.   To learn more about what you can do with MyChart, go to [email or web address].    Your next appointment:   3 month(s)  Provider:   Jodelle Red, MD    Other Instructions  Some nausea is normal, as is decreased appetite. If you have severe nausea or stomach pain, please contact us and stop the medication.   Teaching done with demo pen today.  Look at the American Heart Association website for recipe/food suggestions.

## 2023-05-06 NOTE — Telephone Encounter (Signed)
Patient started on wegovy today in clinic, will likely need prior auth. Please use diagnosis' for cardiac reasons!

## 2023-05-08 ENCOUNTER — Telehealth: Payer: Self-pay

## 2023-05-08 ENCOUNTER — Encounter (HOSPITAL_BASED_OUTPATIENT_CLINIC_OR_DEPARTMENT_OTHER): Payer: Self-pay

## 2023-05-08 ENCOUNTER — Other Ambulatory Visit (HOSPITAL_COMMUNITY): Payer: Self-pay

## 2023-05-08 ENCOUNTER — Other Ambulatory Visit (HOSPITAL_BASED_OUTPATIENT_CLINIC_OR_DEPARTMENT_OTHER): Payer: Self-pay

## 2023-05-08 NOTE — Telephone Encounter (Signed)
Pharmacy Patient Advocate Encounter  Prior Authorization for WEGOVY has been approved.    Effective dates: 11/05/22 through 11/05/23  Mina Babula, CPhT Pharmacy Patient Advocate Specialist Direct Number: [phone] Fax: [phone] 

## 2023-05-08 NOTE — Telephone Encounter (Signed)
Pharmacy Patient Advocate Encounter   Received notification from [facility] MEDIC[facility] that prior authorization for [facility] is needed.    PA submitted on 05/08/23 Key [id number] Status is pending  Haze Rushing, CPhT Pharmacy Patient Advocate Specialist Direct Number: [phone] Fax: [phone]

## 2023-05-08 NOTE — Telephone Encounter (Signed)
PA initiated, please see separate encounter for updates on determination. (I will route you back in once a decision has been made)  Ludella Pranger, CPhT Pharmacy Patient Advocate Specialist Direct Number: [phone] Fax: [phone]  

## 2023-05-12 ENCOUNTER — Other Ambulatory Visit (HOSPITAL_BASED_OUTPATIENT_CLINIC_OR_DEPARTMENT_OTHER): Payer: Self-pay

## 2023-08-16 ENCOUNTER — Ambulatory Visit (HOSPITAL_BASED_OUTPATIENT_CLINIC_OR_DEPARTMENT_OTHER): Payer: Medicare HMO | Admitting: Cardiology

## 2023-08-16 ENCOUNTER — Encounter (HOSPITAL_BASED_OUTPATIENT_CLINIC_OR_DEPARTMENT_OTHER): Payer: Self-pay | Admitting: Cardiology

## 2023-08-16 VITALS — BP 120/52 | HR 65 | Ht 61.0 in | Wt 240.4 lb

## 2023-08-16 DIAGNOSIS — I2510 Atherosclerotic heart disease of native coronary artery without angina pectoris: Secondary | ICD-10-CM | POA: Diagnosis not present

## 2023-08-16 DIAGNOSIS — I4510 Unspecified right bundle-branch block: Secondary | ICD-10-CM

## 2023-08-16 DIAGNOSIS — G4733 Obstructive sleep apnea (adult) (pediatric): Secondary | ICD-10-CM

## 2023-08-16 DIAGNOSIS — Z951 Presence of aortocoronary bypass graft: Secondary | ICD-10-CM

## 2023-08-16 DIAGNOSIS — I440 Atrioventricular block, first degree: Secondary | ICD-10-CM

## 2023-08-16 DIAGNOSIS — I82401 Acute embolism and thrombosis of unspecified deep veins of right lower extremity: Secondary | ICD-10-CM | POA: Diagnosis not present

## 2023-08-16 DIAGNOSIS — I10 Essential (primary) hypertension: Secondary | ICD-10-CM

## 2023-08-16 NOTE — Patient Instructions (Signed)

## 2023-08-16 NOTE — Progress Notes (Signed)
Cardiology Office Note:  .    Date:  08/16/2023  ID:  [name], DOB 09-05-1951, MRN [id number] PCP: Merri Brunette, MD  [facility] Providers Cardiologist:  Jodelle Red, MD     History of Present Illness: [name] Kitchen    [name] is a 72 y.o. female with a hx of CAD s/p CABG surgery by Dr. Laneta Simmers 08/2007 with a LIMA to the LAD, vein to the obtuse marginal, vein to the RCA, hypertension, hyperlipidemia, systemic lupus erythematosus, OSA, and obesity, who presents for follow-up today. She was initially seen 10/01/2022 as a new consult at the request of Merri Brunette, MD for preoperative clearance.   Prior cardiac history:   -Stress test 08/2017 revealed no ischemia, low-risk nuclear study, small inferobasal defect, EF 61%.  Echocardiogram  04/2016 showing normal LV function with grade 2 diastolic dysfunction, and no significant valvular abnormalities.  -CAD s/p CABG surgery by Dr. Laneta Simmers with a LIMA to the LAD, vein to the obtuse marginal, vein to the RCA in 2008   At her visit 05/2023, she described a recent episode that initially began as a fluttering in her central chest, then progressed to a constant nagging pain above the left breast. Eventually this resolved but returned the next day. She tried drinking ginger tea with improvement in her symptoms. Of note, at one time she was lying down on her left side while experiencing the chest pain. There was also improvement in her pain after she shifted to her right side. She struggles with not eating. We discussed semaglutide for secondary prevention. Discussed risks/benefits. She was amenable and teaching was provided.    Today, she states she is feeling good with no new cardiovascular concerns. No recurring issues with chest fluttering or chest pain. She denies any physical limitations. In the office her blood pressure is 120/52.  At this time she is not using her CPAP. She would like to try a different type of mask to see  if that helps. Currently she has a full face mask and she struggles with claustrophobic feelings.  She is planning to go on a cruise in the near future. We discussed OTC nausea medications to try.  She denies any palpitations, chest pain, shortness of breath, peripheral edema, lightheadedness, headaches, syncope, orthopnea, or PND.  ROS:  Please see the history of present illness. ROS otherwise negative except as noted.   Studies Reviewed: [name] Kitchen    EKG Interpretation Date/Time:  Friday August 16 2023 10:17:23 EDT Ventricular Rate:  65 PR Interval:  200 QRS Duration:  154 QT Interval:  442 QTC Calculation: 459 R Axis:   6  Text Interpretation: Sinus rhythm with occasional Premature ventricular complexes Right bundle branch block Inferior infarct (cited on or before 01-Sep-2007) Confirmed by Jodelle Red [phone]) on 08/16/2023 10:22:46 AM    Physical Exam:    VS:  BP (!) 120/52 (BP Location: Left Arm, Patient Position: Sitting, Cuff Size: Large)   Pulse 65   Ht 5\' 1"  (1.549 m)   Wt 240 lb 6.4 oz (109 kg)   SpO2 94%   BMI 45.42 kg/m    Wt Readings from Last 3 Encounters:  08/16/23 240 lb 6.4 oz (109 kg)  05/06/23 243 lb (110.2 kg)  10/01/22 246 lb 6.4 oz (111.8 kg)    GEN: Well nourished, well developed in no acute distress HEENT: Normal, moist mucous membranes NECK: No JVD CARDIAC: regular rhythm, normal S1 and S2, no rubs or gallops. No murmur. VASCULAR: Radial  and DP pulses 2+ bilaterally. No carotid bruits RESPIRATORY:  Clear to auscultation without rales, wheezing or rhonchi  ABDOMEN: Soft, non-tender, non-distended MUSCULOSKELETAL:  Ambulates independently SKIN: Warm and dry, no edema NEUROLOGIC:  Alert and oriented x 3. No focal neuro deficits noted. PSYCHIATRIC:  Normal affect   ASSESSMENT AND PLAN: .    Coronary artery disease History of CABG in 08/2007 Hypercholesterolemia -no angina -no change in functional capacity -states she is not on an aspirin  due to GI upset -continue rosuvastatin 20 mg daily. Most recent LDL 68 -we discussed semaglutide for secondary prevention today. Discussed risks/benefits. She is amenable if it becomes more affordable for her in the future   Hypertension -continue losartan-HCTZ, metoprolol 50 mg BID.   OSA: on CPAP. Sent a message to see if we can get her a mask fitting   RBBB: chronic   Obesity: BMI 45  -discussed weight loss, diet, exercise today. See above, would be interested in GLPs when more affordable   Cardiac risk counseling and prevention recommendations: -recommend heart healthy/Mediterranean diet, with whole grains, fruits, vegetable, fish, lean meats, nuts, and olive oil. Limit salt. -recommend moderate walking, 3-5 times/week for 30-50 minutes each session. Aim for at least 150 minutes.week. Goal should be pace of 3 miles/hours, or walking 1.5 miles in 30 minutes -recommend avoidance of tobacco products. Avoid excess alcohol.  Dispo: Follow-up in 6 months, or sooner as needed.  I,Mathew Stumpf,acting as a Neurosurgeon for [employer], MD.,have documented all relevant documentation on the behalf of Jodelle Red, MD,as directed by  Jodelle Red, MD while in the presence of Jodelle Red, MD.  I, Jodelle Red, MD, have reviewed all documentation for this visit. The documentation on 10/06/23 for the exam, diagnosis, procedures, and orders are all accurate and complete.   Signed, Jodelle Red, MD

## 2023-08-19 ENCOUNTER — Telehealth: Payer: Self-pay

## 2023-08-19 DIAGNOSIS — G4733 Obstructive sleep apnea (adult) (pediatric): Secondary | ICD-10-CM

## 2023-08-19 NOTE — Telephone Encounter (Signed)
-----   Message from [facility] sent at 08/16/2023 10:30 AM EDT ----- Regarding: mask fitting for CPAP Hi [name]--this patient isn't using her CPAP because she says the mask is uncomfortable. It looks like she uses [facility] for her DME. Is there a way to get her fitted with a different mask so she can use her CPAP? Thanks!

## 2023-10-26 ENCOUNTER — Other Ambulatory Visit (HOSPITAL_BASED_OUTPATIENT_CLINIC_OR_DEPARTMENT_OTHER): Payer: Self-pay | Admitting: Cardiology

## 2023-12-05 ENCOUNTER — Other Ambulatory Visit: Payer: Self-pay

## 2023-12-05 DIAGNOSIS — I10 Essential (primary) hypertension: Secondary | ICD-10-CM

## 2023-12-05 MED ORDER — METOPROLOL TARTRATE 50 MG PO TABS: ORAL_TABLET | ORAL | 2 refills | Status: DC

## 2024-01-08 DIAGNOSIS — E7800 Pure hypercholesterolemia, unspecified: Secondary | ICD-10-CM | POA: Diagnosis not present

## 2024-01-08 DIAGNOSIS — H35033 Hypertensive retinopathy, bilateral: Secondary | ICD-10-CM | POA: Diagnosis not present

## 2024-01-08 DIAGNOSIS — R7303 Prediabetes: Secondary | ICD-10-CM | POA: Diagnosis not present

## 2024-01-08 DIAGNOSIS — I2510 Atherosclerotic heart disease of native coronary artery without angina pectoris: Secondary | ICD-10-CM | POA: Diagnosis not present

## 2024-01-08 DIAGNOSIS — I10 Essential (primary) hypertension: Secondary | ICD-10-CM | POA: Diagnosis not present

## 2024-01-08 DIAGNOSIS — M329 Systemic lupus erythematosus, unspecified: Secondary | ICD-10-CM | POA: Diagnosis not present

## 2024-01-19 ENCOUNTER — Other Ambulatory Visit (HOSPITAL_BASED_OUTPATIENT_CLINIC_OR_DEPARTMENT_OTHER): Payer: Self-pay | Admitting: Cardiology

## 2024-03-03 ENCOUNTER — Ambulatory Visit (HOSPITAL_BASED_OUTPATIENT_CLINIC_OR_DEPARTMENT_OTHER): Payer: Self-pay | Admitting: Family

## 2024-03-03 VITALS — BP 116/64 | HR 61 | Ht 61.6 in | Wt 242.5 lb

## 2024-03-03 DIAGNOSIS — I10 Essential (primary) hypertension: Secondary | ICD-10-CM | POA: Diagnosis not present

## 2024-03-03 DIAGNOSIS — E785 Hyperlipidemia, unspecified: Secondary | ICD-10-CM | POA: Diagnosis not present

## 2024-03-03 DIAGNOSIS — I25118 Atherosclerotic heart disease of native coronary artery with other forms of angina pectoris: Secondary | ICD-10-CM | POA: Diagnosis not present

## 2024-03-03 DIAGNOSIS — G4733 Obstructive sleep apnea (adult) (pediatric): Secondary | ICD-10-CM | POA: Diagnosis not present

## 2024-03-03 NOTE — Progress Notes (Signed)
 Cardiology Office Note:  .   Date:  03/06/2024  ID:  [name], DOB 07-Nov-1950, MRN [id number] PCP: Kylie Hood, MD  [facility] Providers Cardiologist:  Kylie Donning, MD    History of Present Illness: [name]   [name] is a 73 y.o. female with history of CAD s/p CABG 2008 with LIMA-LAD, SVG-OM, SVG-RCA, HTN, HLD, systemic lupus erythematosus, OSA, obesity.  Discussed the use of AI scribe software for clinical note transcription with the patient, who gave verbal consent to proceed.  History of Present Illness [name] is a 73 year old female who presents for a cardiovascular follow-up.  Endorses feeling overall well since last seen.  She has experienced no episodes of chest pain, dyspnea,.  Rare lightheadedness when dehydrated. Blood pressure monitoring at home is inconsistent, but no concerning readings have been noted. She is not engaging in regular exercise but plans to resume water aerobics. Her cholesterol levels were excellent over the summer when checked by PCP team, and she continues on rosuvastatin  20 mg daily while maintaining a diet low in fried foods. No peripheral edema or claudication is present, though she experiences knee pain that improves with walking.   ROS: Please see the history of present illness.    All other systems reviewed and are negative.   Studies Reviewed: .        Cardiac Studies & Procedures   ______________________________________________________________________________________________   STRESS TESTS  MYOCARDIAL PERFUSION IMAGING 08/23/2017  Narrative  The left ventricular ejection fraction is normal (55-65%).  Nuclear stress EF: 61%.  There was no ST segment deviation noted during stress.  Defect 1: There is a small defect of severe severity.  This is a low risk study.  Low risk stress nuclear study with fixed inferobasal defect suggestive of prior infarct; no ischemia; EF 61 with akinesis of the  inferobasal wall.   ECHOCARDIOGRAM  ECHOCARDIOGRAM COMPLETE 05/03/2016  Narrative *[name]* [address] [place], [place] [address] [phone]  ------------------------------------------------------------------- Transthoracic Echocardiography  (Report amended )  Patient:    Kylie, Swanson MR #:       [id number] Study Date: 05/03/2016 Gender:     F Age:        89 Height:     154.9 cm Weight:     120.7 kg BSA:        2.36 m^2 Pt. Status: Room:  ORDERING     [name], M.D. REFERRING    [name], M.D. ATTENDING    [name], M.D. PERFORMING   [facility], Outpatient SONOGRAPHER  [name], RDCS  cc:  ------------------------------------------------------------------- LV EF: 60% -   65%  ------------------------------------------------------------------- Indications:      CAD (I25.10).  ------------------------------------------------------------------- History:   PMH:  Edema, Right Bundle Branch Block, Obesity, Lupus, Obstructive Sleep Apnea.  Coronary artery disease.  Risk factors: Family history of coronary artery disease. Hypertension. Dyslipidemia.  ------------------------------------------------------------------- Study Conclusions  - Left ventricle: The cavity size was normal. Wall thickness was normal. Systolic function was normal. The estimated ejection fraction was in the range of 60% to 65%. Wall motion was normal; there were no regional wall motion abnormalities. Features are consistent with a pseudonormal left ventricular filling pattern, with concomitant abnormal relaxation and increased filling pressure (grade 2 diastolic dysfunction). - Aortic valve: There was no stenosis. - Mitral valve: There was no significant regurgitation. - Right ventricle: Poorly visualized. The cavity size was normal. Systolic function was normal. - Pulmonary arteries: No complete  TR doppler jet so unable to estimate PA systolic  pressure. - Inferior vena cava: The vessel was normal in size. The respirophasic diameter changes were in the normal range (= 50%), consistent with normal central venous pressure.  Impressions:  - Normal LV size and systolic function, EF 60-65%. Moderate diastolic dysfunction. Normal RV size and systolic function. No significant valvular abnormalities.  ------------------------------------------------------------------- Labs, prior tests, procedures, and surgery: Coronary artery bypass grafting.  Transthoracic echocardiography.  M-mode, complete 2D, spectral Doppler, and color Doppler.  Birthdate:  Patient birthdate: 1951-05-06.  Age:  Patient is 73 yr old.  Sex:  Gender: female. BMI: 50.3 kg/m^2.  Blood pressure:     120/58  Patient status: Outpatient.  Study date:  Study date: 05/03/2016. Study time: 10:13 AM.  Location:  [facility]  -------------------------------------------------------------------  ------------------------------------------------------------------- Left ventricle:  The cavity size was normal. Wall thickness was normal. Systolic function was normal. The estimated ejection fraction was in the range of 60% to 65%. Wall motion was normal; there were no regional wall motion abnormalities. Features are consistent with a pseudonormal left ventricular filling pattern, with concomitant abnormal relaxation and increased filling pressure (grade 2 diastolic dysfunction).  ------------------------------------------------------------------- Aortic valve:   Trileaflet; mildly calcified leaflets.  Doppler: There was no stenosis.   There was no regurgitation.  ------------------------------------------------------------------- Aorta:  Aortic root: The aortic root was normal in size. Ascending aorta: The ascending aorta was normal in size.  ------------------------------------------------------------------- Mitral valve:   Normal thickness leaflets .  Doppler:    There was no evidence for stenosis.   There was no significant regurgitation. Peak gradient (D): 4 mm Hg.  ------------------------------------------------------------------- Left atrium:  The atrium was normal in size.  ------------------------------------------------------------------- Right ventricle:  Poorly visualized. The cavity size was normal. Systolic function was normal.  ------------------------------------------------------------------- Pulmonic valve:    Structurally normal valve.   Cusp separation was normal.  Doppler:  Transvalvular velocity was within the normal range. There was trivial regurgitation.  ------------------------------------------------------------------- Tricuspid valve:   Doppler:  There was trivial regurgitation.  ------------------------------------------------------------------- Pulmonary artery:   No complete TR doppler jet so unable to estimate PA systolic pressure.  ------------------------------------------------------------------- Right atrium:  The atrium was normal in size.  ------------------------------------------------------------------- Pericardium:  There was no pericardial effusion.  ------------------------------------------------------------------- Systemic veins: Inferior vena cava: The vessel was normal in size. The respirophasic diameter changes were in the normal range (= 50%), consistent with normal central venous pressure. Diameter: 14.3 mm.  ------------------------------------------------------------------- Measurements  IVC                                   Value        Reference ID                                    14.3  mm     ---------  Left ventricle                        Value        Reference LV ID, ED, PLAX chordal               43.2  mm     43 - 52 LV ID, ES, PLAX chordal               30.1  mm  23 - 38 LV fx shortening, PLAX chordal        30    %      >=29 LV PW thickness, ED                   11.6   mm     --------- IVS/LV PW ratio, ED                   0.97         <=1.3 Stroke volume, 2D                     63    ml     --------- Stroke volume/bsa, 2D                 27    ml/m^2 --------- LV e&', lateral                        11.7  cm/s   --------- LV E/e&', lateral                      8.54         --------- LV e&', medial                         8.93  cm/s   --------- LV E/e&', medial                       11.19        --------- LV e&', average                        10.32 cm/s   --------- LV E/e&', average                      9.68         --------- Longitudinal strain, TDI              21    %      ---------  Ventricular septum                    Value        Reference IVS thickness, ED                     11.3  mm     ---------  LVOT                                  Value        Reference LVOT ID, S                            20    mm     --------- LVOT area                             3.14  cm^2   --------- LVOT peak velocity, S                 78.3  cm/s   --------- LVOT mean velocity, S  53    cm/s   --------- LVOT VTI, S                           20.1  cm     ---------  Aorta                                 Value        Reference Aortic root ID, ED                    27    mm     --------- Ascending aorta ID, A-P, S            30    mm     ---------  Left atrium                           Value        Reference LA ID, A-P, ES                        45    mm     --------- LA ID/bsa, A-P                        1.91  cm/m^2 <=2.2 LA volume, S                          49.5  ml     --------- LA volume/bsa, S                      21    ml/m^2 --------- LA volume, ES, 1-p A4C                39.4  ml     --------- LA volume/bsa, ES, 1-p A4C            16.7  ml/m^2 --------- LA volume, ES, 1-p A2C                57.1  ml     --------- LA volume/bsa, ES, 1-p A2C            24.2  ml/m^2 ---------  Mitral valve                          Value         Reference Mitral E-wave peak velocity           99.9  cm/s   --------- Mitral A-wave peak velocity           70.8  cm/s   --------- Mitral deceleration time       (H)    236   ms     150 - 230 Mitral peak gradient, D               4     mm Hg  --------- Mitral E/A ratio, peak                1.4          ---------  Tricuspid valve                       Value  Reference Tricuspid regurg peak velocity        183   cm/s   ---------  Right ventricle                       Value        Reference TAPSE                                 15    mm     --------- RV s&', lateral, S                     11    cm/s   ---------  Legend: (L)  and  (H)  mark values outside specified reference range.  ------------------------------------------------------------------- Albesa Alter, M.D. 2017-06-29T14:07:44          ______________________________________________________________________________________________      Risk Assessment/Calculations:            Physical Exam:   VS:  BP 116/64   Pulse 61   Ht 5' 1.6" (1.565 m)   Wt 242 lb 8 oz (110 kg)   SpO2 95%   BMI 44.93 kg/m    Wt Readings from Last 3 Encounters:  03/03/24 242 lb 8 oz (110 kg)  08/16/23 240 lb 6.4 oz (109 kg)  05/06/23 243 lb (110.2 kg)    GEN: Well nourished, well developed in no acute distress NECK: No JVD; No carotid bruits CARDIAC: RRR, no murmurs, rubs, gallops RESPIRATORY:  Clear to auscultation without rales, wheezing or rhonchi  ABDOMEN: Soft, non-tender, non-distended EXTREMITIES:  No edema; No deformity   ASSESSMENT AND PLAN: .    Assessment & Plan Hypertension Blood pressure well-controlled.  - Continue losartan -HCTZ 50-12.5 mg daily, metoprolol  tartrate 50 mg twice daily. - Refer to PREP exercise program at the [facility]  CAD/hyperlipidemia, LDL goal less than 70 Stable with no anginal symptoms. No indication for ischemic evaluation.  Cholesterol levels well-controlled with medication and  dietary adherence. - Continue rosuvastatin  20 mg daily. - Encourage dietary modifications.  Obstructive Sleep Apnea Not using CPAP due to mask discomfort. Plans to try different masks for compliance. - Coordinate with [facility] and sleep team for a new CPAP mask.  Obesity Weight loss via diet and exercise encouraged. Discussed the impact being overweight would have on cardiovascular risk.           Dispo: Follow-up in 6 months  Signed, Clearnce Curia, NP

## 2024-03-03 NOTE — Patient Instructions (Signed)
 Medication Instructions:  Your physician recommends that you continue on your current medications as directed. Please refer to the Current Medication list given to you today.  Follow-Up: At [facility], you and your health needs are our priority.  As part of our continuing mission to provide you with exceptional heart care, our providers are all part of one team.  This team includes your primary Cardiologist (physician) and Advanced Practice Providers or APPs (Physician Assistants and Nurse Practitioners) who all work together to provide you with the care you need, when you need it.  Please follow up in 6 months with Dr. Veryl Gottron, Slater Duncan, NP or Neomi Banks, NP

## 2024-03-04 ENCOUNTER — Telehealth: Payer: Self-pay

## 2024-03-04 NOTE — Telephone Encounter (Signed)
 Left VM, notified patient that [facility] will reach out to patient to confirm receipt of Mask order for CPAP

## 2024-03-04 NOTE — Telephone Encounter (Signed)
-----   Message from [facility] Elvia Hammans sent at 03/03/2024  6:57 PM EDT ----- Please call pt. ----- Message ----- From: Guss Legacy, RN Sent: 03/03/2024   4:01 PM EDT To: Jud Norris, RN; [facility] Div Sleep Studies  Pt seen 6months ago and message was sent to TK that pt needed new CPAP mask, no follow up ever made with pt. Still needs new cpap mask.   Best, Guss Legacy, RN

## 2024-03-06 ENCOUNTER — Encounter (HOSPITAL_BASED_OUTPATIENT_CLINIC_OR_DEPARTMENT_OTHER): Payer: Self-pay | Admitting: Family

## 2024-03-09 ENCOUNTER — Telehealth: Payer: Self-pay

## 2024-03-09 NOTE — Telephone Encounter (Signed)
 Called [facility] program referral; left voicemail requesting return call.

## 2024-03-09 NOTE — Telephone Encounter (Signed)
 Returned my call, explained PREP she would like to attend next class at [facility] On May May 12, every M/W at 12 noon; assessment visit scheduled for May 12 at 11:15

## 2024-03-12 NOTE — Telephone Encounter (Signed)
 Spoke with patient to provide update. CPAP mask order was submitted to [facility] on 03/04/24 and receipt confirmed today by [facility] representative. Encouraged pt to call back in 7 business days if she has still not heard anything. Pt verbalized understanding and agreement with plan.

## 2024-03-16 NOTE — Progress Notes (Signed)
 [facility] PREP Evaluation  Patient Details  Name: [name] MRN: [id number] Date of Birth: [birth year] Age: 73 y.o. PCP: Faustina Hood, MD  Vitals:   03/16/24 1144  BP: (!) 130/90  Pulse: 61  SpO2: 95%  Weight: 242 lb 12.8 oz (110.1 kg)     [facility] Eval - 03/16/24 1100       [facility] "PREP" Location   [facility] "PREP" Location [facility]      Referral    Referring Provider Walker    Reason for referral High Cholesterol;Hypertension;Inactivity;Obesitity/Overweight    Program Start Date 03/16/24      Measurement   Waist Circumference 58 inches    Hip Circumference 59 inches    Body fat --   E4     Information for Trainer   Goals --   Establish exercise routine, increase self esteem   Current Exercise none    Orthopedic Concerns --   most likely OA L knee   Pertinent Medical History --   HTN, DVT, OSA, CAD, lupus, HLD, CABG 2008   Medications that affect exercise Beta blocker      Timed Up and Go (TUGS)   Timed Up and Go Low risk <9 seconds      Mobility and Daily Activities   I find it easy to walk up or down two or more flights of stairs. 2    I have no trouble taking out the trash. 1    I do housework such as vacuuming and dusting on my own without difficulty. 1    I can easily lift a gallon of milk (8lbs). 4    I can easily walk a mile. 2    I have no trouble reaching into high cupboards or reaching down to pick up something from the floor. 2    I do not have trouble doing out-door work such as Loss adjuster, chartered, raking leaves, or gardening. 2      Mobility and Daily Activities   I feel younger than my age. 2    I feel independent. 4    I feel energetic. 2    I live an active life.  1    I feel strong. 2    I feel healthy. 1    I feel active as other people my age. 2      How fit and strong are you.   Fit and Strong Total Score 28            Past Medical History:  Diagnosis Date   CAD (coronary artery disease)    Hyperlipidemia    Hypertension     Obesity    OSA (obstructive sleep apnea)    RBBB    Sleep apnea    Systemic lupus erythematosus (HCC)    Past Surgical History:  Procedure Laterality Date   CARDIAC CATHETERIZATION  09/02/2007   CORONARY ANGIOPLASTY     CORONARY ARTERY BYPASS GRAFT  09/02/2007   LIMA to the LAD,SVG to obtuse marginal branch of the left CX,SVG to the RCA   Social History   Tobacco Use  Smoking Status Never  Smokeless Tobacco Never  To begin PREP class today at [employer] every M/W at 12  Esaias Cleavenger B Lynnann Knudsen 03/16/2024, 11:47 AM

## 2024-03-16 NOTE — Progress Notes (Signed)
 [facility] PREP Weekly Session  Patient Details  Name: [name] MRN: [id number] Date of Birth: [birth year] Age: 73 y.o. PCP: Faustina Hood, MD  There were no vitals filed for this visit.   [facility] Weekly seesion - 03/16/24 1300       [facility] "PREP" Location   [facility] "PREP" Location [facility]      Weekly Session   Topic Discussed Goal setting and welcome to the program   Introductions, review of notebooks, tour of facility, option to work out on cardio machine   Classes attended to date 1             Jim Motts 03/16/2024, 1:59 PM

## 2024-03-20 ENCOUNTER — Encounter (HOSPITAL_BASED_OUTPATIENT_CLINIC_OR_DEPARTMENT_OTHER): Payer: Self-pay

## 2024-03-23 NOTE — Progress Notes (Signed)
 [facility] PREP Weekly Session  Patient Details  Name: [name] MRN: [id number] Date of Birth: 30-Aug-1951 Age: 73 y.o. PCP: Faustina Hood, MD  Vitals:   03/23/24 1355  Weight: 241 lb 3.2 oz (109.4 kg)     [facility] Weekly seesion - 03/23/24 1300       [facility] "PREP" Location   [facility] "PREP" Location [facility]      Weekly Session   Topic Discussed Importance of resistance training;Other ways to be active   Goal: work up to 150 mins/cardio/week; strength training start at twice/wk work up to 3 times a wk for 20-40 minutes   Classes attended to date 3             Carisma Troupe B Creta Dorame 03/23/2024, 1:56 PM

## 2024-04-01 NOTE — Progress Notes (Signed)
 [facility] PREP Weekly Session  Patient Details  Name: DINITA MIGLIACCIO MRN: [id number] Date of Birth: 07/14/1951 Age: 73 y.o. PCP: Faustina Hood, MD  Vitals:   04/01/24 1341  Weight: 242 lb 9.6 oz (110 kg)     [facility] Weekly seesion - 04/01/24 1300       [facility] "PREP" Location   [facility] "PREP" Location [facility]      Weekly Session   Topic Discussed Healthy eating tips   Discussion on foods to reduce and to increase. Introduced YUKA app.   Minutes exercised this week 15 minutes    Classes attended to date 5             [facility] 04/01/2024, 1:42 PM

## 2024-04-06 NOTE — Progress Notes (Signed)
 [facility]  Patient Details  Name: [name] MRN: [id number] Date of Birth: [birth year] Age: 73 y.o. PCP: Faustina Hood, MD  Vitals:   04/06/24 1319  Weight: 243 lb 4.8 oz (110.4 kg)     [facility] Weekly seesion - 04/06/24 1300       [facility]PREP[facility] Location   [facility]PREP[facility] Location [facility]      Weekly Session   Topic Discussed Health habits   Information on healthy habits and sugar talk   Minutes exercised this week 60 minutes    Classes attended to date 6             [facility] 04/06/2024, 1:20 PM

## 2024-04-13 NOTE — Progress Notes (Signed)
 [facility] Weekly Session  Patient Details  Name: [name] MRN: [id number] Date of Birth: Aug 18, 1951 Age: 73 y.o. PCP: Faustina Hood, MD  Vitals:   04/13/24 1404  Weight: 240 lb (108.9 kg)     [facility] Weekly seesion - 04/13/24 1400       [facility] Location   [facility]PREP[facility] Location [facility]      Weekly Session   Topic Discussed Restaurant Eating   Tips for eating out and salt demo   Minutes exercised this week 120 minutes    Classes attended to date 8             [facility] 04/13/2024, 2:05 PM

## 2024-04-16 NOTE — Telephone Encounter (Signed)
 Spoke with patient. She confirmed that she received her new CPAP mask from [employer]. She denies additional needs at this time.

## 2024-04-20 NOTE — Progress Notes (Signed)
 [facility] Weekly Session  Patient Details  Name: [name] MRN: [id number] Date of Birth: 1951/08/01 Age: 73 y.o. PCP: Faustina Hood, MD  Vitals:   04/20/24 1302  Weight: 238 lb 3.2 oz (108 kg)     [facility] Weekly seesion - 04/20/24 1300       [facility] PRE[facility] Location   [facility] PREP Location [facility]      Weekly Session   Topic Discussed Stress management and problem solving   Breath work example, sleep apnea, sleep tips   Minutes exercised this week 120 minutes    Classes attended to date 10          [facility] 04/20/2024, 1:03 PM

## 2024-04-27 NOTE — Progress Notes (Signed)
 [facility] Weekly Session  Patient Details  Name: [name] MRN: [id number] Date of Birth: [birth year] Age: 72 y.o. PCP: Claudene Pellet, MD  Vitals:   04/27/24 1308  Weight: 238 lb 3.2 oz (108 kg)     [facility] Weekly seesion - 04/27/24 1300       [facility] PREP Location   [facility] PREP Location [facility]      Weekly Session   Topic Discussed Expectations and non-scale victories   Staying positive, review of past tips   Minutes exercised this week 150 minutes    Classes attended to date 12          [facility] 04/27/2024, 1:09 PM

## 2024-05-01 DIAGNOSIS — M329 Systemic lupus erythematosus, unspecified: Secondary | ICD-10-CM | POA: Diagnosis not present

## 2024-05-01 DIAGNOSIS — M79604 Pain in right leg: Secondary | ICD-10-CM | POA: Diagnosis not present

## 2024-05-01 DIAGNOSIS — M170 Bilateral primary osteoarthritis of knee: Secondary | ICD-10-CM | POA: Diagnosis not present

## 2024-05-01 DIAGNOSIS — M65331 Trigger finger, right middle finger: Secondary | ICD-10-CM | POA: Diagnosis not present

## 2024-05-02 ENCOUNTER — Other Ambulatory Visit: Payer: Self-pay | Admitting: Cardiology

## 2024-05-02 DIAGNOSIS — I10 Essential (primary) hypertension: Secondary | ICD-10-CM

## 2024-05-04 NOTE — Progress Notes (Signed)
 [facility] Weekly Session  Patient Details  Name: [name] MRN: [id number] Date of Birth: 1951/01/10 Age: 73 y.o. PCP: Claudene Pellet, MD  Vitals:   05/04/24 1313  Weight: 238 lb 9.6 oz (108.2 kg)     [facility] Weekly seesion - 05/04/24 1300       [facility] PREP Location   [facility] PREP Location [facility]      Weekly Session   Topic Discussed Other   Portion control, visualize your portion size demo; reivew of [employer] food label.   Minutes exercised this week 200 minutes    Classes attended to date 7          [name] Lonnell Chaput 05/04/2024, 1:14 PM

## 2024-05-06 DIAGNOSIS — Z79899 Other long term (current) drug therapy: Secondary | ICD-10-CM | POA: Diagnosis not present

## 2024-05-06 DIAGNOSIS — H5203 Hypermetropia, bilateral: Secondary | ICD-10-CM | POA: Diagnosis not present

## 2024-05-06 DIAGNOSIS — H2513 Age-related nuclear cataract, bilateral: Secondary | ICD-10-CM | POA: Diagnosis not present

## 2024-05-18 NOTE — Progress Notes (Signed)
 [facility] Weekly Session  Patient Details  Name: [name] MRN: [id number] Date of Birth: [birth year] Age: 73 y.o. PCP: Claudene Pellet, MD  Vitals:   05/18/24 1302  Weight: 238 lb 3.2 oz (108 kg)     [facility] Weekly seesion - 05/18/24 1300       [facility] PREP Location   [facility] PREP Location [facility]      Weekly Session   Topic Discussed Finding support   [facility] membership talk w/Hanh   Minutes exercised this week 210 minutes    Classes attended to date 50          Omayra Tulloch B Kyrin Garn 05/18/2024, 1:03 PM

## 2024-05-25 NOTE — Progress Notes (Signed)
 [facility] Weekly Session  Patient Details  Name: [name] MRN: [id number] Date of Birth: February 10, 1951 Age: 73 y.o. PCP: Claudene Pellet, MD  Vitals:   05/25/24 1447  Weight: 237 lb (107.5 kg)     [facility] Weekly seesion - 05/25/24 1400       [facility] PREP Location   [facility] PREP Location [facility]      Weekly Session   Topic Discussed Hitting roadblocks   Reviewed full program lifestyle change Rx; reviewed end of program survey and setting goals/activity plan for next 3 months; Review of 100 calorie snack food labels.   Minutes exercised this week 215 minutes    Classes attended to date 85          Adonna [name] 05/25/2024, 2:48 PM

## 2024-05-27 ENCOUNTER — Other Ambulatory Visit: Payer: Self-pay | Admitting: Family Medicine

## 2024-05-27 DIAGNOSIS — I739 Peripheral vascular disease, unspecified: Secondary | ICD-10-CM

## 2024-05-27 DIAGNOSIS — R6889 Other general symptoms and signs: Secondary | ICD-10-CM

## 2024-05-28 ENCOUNTER — Inpatient Hospital Stay
Admission: RE | Admit: 2024-05-28 | Discharge: 2024-05-28 | Discharge status: Home / Self Care | Source: Ambulatory Visit | Attending: Family Medicine | Admitting: Family Medicine

## 2024-05-28 DIAGNOSIS — I739 Peripheral vascular disease, unspecified: Secondary | ICD-10-CM | POA: Diagnosis not present

## 2024-05-28 DIAGNOSIS — R6889 Other general symptoms and signs: Secondary | ICD-10-CM

## 2024-06-01 NOTE — Progress Notes (Signed)
 [facility] Weekly Session  Patient Details  Name: [name] MRN: [id number] Date of Birth: 1951-02-28 Age: 73 y.o. PCP: Claudene Pellet, MD  Vitals:   06/01/24 1308  Weight: 237 lb 3.2 oz (107.6 kg)     [facility] Weekly seesion - 06/01/24 1300       [facility] PREP Location   [facility] PREP Location [facility]      Weekly Session   Topic Discussed Other   Fit testing, how fit and strong survey completed; appt scheduled Wed for final assessment visit; shared healthy habit meditation   Minutes exercised this week 235 minutes    Classes attended to date 70          [name] B [name] 06/01/2024, 1:10 PM

## 2024-06-03 NOTE — Progress Notes (Signed)
 [facility] Evaluation  Patient Details  Name: [name] MRN: [id number] Date of Birth: 1951/02/04 Age: 73 y.o. PCP: Claudene Pellet, MD  Vitals:   06/03/24 1149  BP: 128/60  Pulse: 68  SpO2: 96%  Weight: 236 lb 6.4 oz (107.2 kg)     [facility] Eval - 06/03/24 1100       [facility] PREP Location   [facility] PREP Location [facility]      Referral    Program Start Date 03/16/24    Program End Date 06/03/24      Measurement   Waist Circumference 58 inches    Waist Circumference End Program 54 inches    Hip Circumference 59 inches    Hip Circumference End Program 58 inches    Body fat --   E4     Mobility and Daily Activities   I find it easy to walk up or down two or more flights of stairs. 3    I have no trouble taking out the trash. 2    I do housework such as vacuuming and dusting on my own without difficulty. 1    I can easily lift a gallon of milk (8lbs). 4    I can easily walk a mile. 2    I have no trouble reaching into high cupboards or reaching down to pick up something from the floor. 2    I do not have trouble doing out-door work such as Loss adjuster, chartered, raking leaves, or gardening. 3      Mobility and Daily Activities   I feel younger than my age. 3    I feel independent. 4    I feel energetic. 2    I live an active life.  3    I feel strong. 2    I feel healthy. 2    I feel active as other people my age. 2      How fit and strong are you.   Fit and Strong Total Score 35         Past Medical History:  Diagnosis Date   CAD (coronary artery disease)    Hyperlipidemia    Hypertension    Obesity    OSA (obstructive sleep apnea)    RBBB    Sleep apnea    Systemic lupus erythematosus (HCC)    Past Surgical History:  Procedure Laterality Date   CARDIAC CATHETERIZATION  09/02/2007   CORONARY ANGIOPLASTY     CORONARY ARTERY BYPASS GRAFT  09/02/2007   LIMA to the LAD,SVG to obtuse marginal branch of the left CX,SVG to the RCA   Social History    Tobacco Use  Smoking Status Never  Smokeless Tobacco Never  BP improved form 130/90 to 128/60 Cardio march improved from 240 to 370 Sit to stands improved from 10 to 12 Bicep curls improved from 13 to 18 Wt loss: 6.4 pounds Inches lost: 5 How fit and strong improved from 28 to 35 Workout sessions completed: 10 Education sessions completed: 9  Darcella Shiffman B Greidy Sherard 06/03/2024, 11:54 AM

## 2024-06-25 DIAGNOSIS — I10 Essential (primary) hypertension: Secondary | ICD-10-CM | POA: Diagnosis not present

## 2024-06-25 DIAGNOSIS — I2510 Atherosclerotic heart disease of native coronary artery without angina pectoris: Secondary | ICD-10-CM | POA: Diagnosis not present

## 2024-06-25 DIAGNOSIS — Z0000 Encounter for general adult medical examination without abnormal findings: Secondary | ICD-10-CM | POA: Diagnosis not present

## 2024-06-25 DIAGNOSIS — M329 Systemic lupus erythematosus, unspecified: Secondary | ICD-10-CM | POA: Diagnosis not present

## 2024-06-25 DIAGNOSIS — E7800 Pure hypercholesterolemia, unspecified: Secondary | ICD-10-CM | POA: Diagnosis not present

## 2024-06-25 DIAGNOSIS — R7303 Prediabetes: Secondary | ICD-10-CM | POA: Diagnosis not present

## 2024-06-25 DIAGNOSIS — G4733 Obstructive sleep apnea (adult) (pediatric): Secondary | ICD-10-CM | POA: Diagnosis not present

## 2024-06-30 ENCOUNTER — Other Ambulatory Visit: Payer: Self-pay | Admitting: Family Medicine

## 2024-06-30 DIAGNOSIS — Z1231 Encounter for screening mammogram for malignant neoplasm of breast: Secondary | ICD-10-CM

## 2024-07-15 ENCOUNTER — Ambulatory Visit

## 2024-07-22 ENCOUNTER — Ambulatory Visit
Admission: RE | Admit: 2024-07-22 | Discharge: 2024-07-22 | Disposition: A | Discharge status: Home / Self Care | Source: Ambulatory Visit | Attending: Family Medicine | Admitting: Family Medicine

## 2024-07-22 DIAGNOSIS — Z1231 Encounter for screening mammogram for malignant neoplasm of breast: Secondary | ICD-10-CM | POA: Diagnosis not present

## 2024-10-27 ENCOUNTER — Telehealth (HOSPITAL_BASED_OUTPATIENT_CLINIC_OR_DEPARTMENT_OTHER): Payer: Self-pay | Admitting: *Deleted

## 2024-10-27 NOTE — Telephone Encounter (Signed)
 Received form from [facility].  Special circumstance form.  Called patient and confirmed the patient is aware of this form.  She requests we fax it to the number provided.  Form signed by Dr. Lonni.  Faxed and will placed to have scanned into chart.
